# Patient Record
Sex: Male | Born: 1965 | Race: White | Hispanic: No | Marital: Married | Smoking: Never smoker
Health system: Southern US, Community
[De-identification: ages and names within clinical notes are randomized; demographics above are authoritative.]

## PROBLEM LIST (undated history)

## (undated) DIAGNOSIS — E785 Hyperlipidemia, unspecified: Secondary | ICD-10-CM

## (undated) DIAGNOSIS — G4730 Sleep apnea, unspecified: Secondary | ICD-10-CM

## (undated) DIAGNOSIS — I10 Essential (primary) hypertension: Secondary | ICD-10-CM

## (undated) DIAGNOSIS — N400 Enlarged prostate without lower urinary tract symptoms: Secondary | ICD-10-CM

## (undated) DIAGNOSIS — J309 Allergic rhinitis, unspecified: Secondary | ICD-10-CM

## (undated) DIAGNOSIS — C801 Malignant (primary) neoplasm, unspecified: Secondary | ICD-10-CM

## (undated) HISTORY — PX: VASECTOMY: SHX75

## (undated) HISTORY — DX: Allergic rhinitis, unspecified: J30.9

## (undated) HISTORY — DX: Essential (primary) hypertension: I10

## (undated) HISTORY — DX: Benign prostatic hyperplasia without lower urinary tract symptoms: N40.0

## (undated) HISTORY — PX: MELANOMA EXCISION: SHX5266

## (undated) HISTORY — PX: COLONOSCOPY: SHX174

## (undated) HISTORY — DX: Malignant (primary) neoplasm, unspecified: C80.1

## (undated) HISTORY — DX: Hyperlipidemia, unspecified: E78.5

## (undated) HISTORY — DX: Sleep apnea, unspecified: G47.30

---

## 2007-05-19 ENCOUNTER — Ambulatory Visit: Payer: Self-pay | Admitting: Gastroenterology

## 2007-06-02 ENCOUNTER — Ambulatory Visit: Payer: Self-pay | Admitting: Gastroenterology

## 2010-07-01 ENCOUNTER — Ambulatory Visit (HOSPITAL_COMMUNITY): Admission: RE | Admit: 2010-07-01 | Discharge: 2010-07-01 | Payer: Self-pay | Admitting: Internal Medicine

## 2011-11-09 LAB — HM DIABETES EYE EXAM: HM Diabetic Eye Exam: NEGATIVE

## 2012-03-02 ENCOUNTER — Other Ambulatory Visit: Payer: Self-pay | Admitting: Internal Medicine

## 2012-03-03 ENCOUNTER — Ambulatory Visit
Admission: RE | Admit: 2012-03-03 | Discharge: 2012-03-03 | Disposition: A | Discharge status: Home / Self Care | Payer: 59 | Source: Ambulatory Visit | Attending: Internal Medicine | Admitting: Internal Medicine

## 2012-03-03 MED ORDER — IOHEXOL 300 MG/ML  SOLN
75.0000 mL | Freq: Once | INTRAMUSCULAR | Status: AC | PRN
  Administered 2012-03-03: 75 mL via INTRAVENOUS

## 2012-03-08 ENCOUNTER — Encounter: Payer: Self-pay | Admitting: Gastroenterology

## 2012-06-30 ENCOUNTER — Ambulatory Visit (AMBULATORY_SURGERY_CENTER): Payer: 59 | Admitting: *Deleted

## 2012-06-30 ENCOUNTER — Encounter: Payer: Self-pay | Admitting: Gastroenterology

## 2012-06-30 VITALS — Ht 74.0 in | Wt 220.4 lb

## 2012-06-30 DIAGNOSIS — Z1211 Encounter for screening for malignant neoplasm of colon: Secondary | ICD-10-CM

## 2012-06-30 DIAGNOSIS — Z800 Family history of malignant neoplasm of digestive organs: Secondary | ICD-10-CM

## 2012-06-30 MED ORDER — MOVIPREP 100 G PO SOLR: ORAL | Status: DC

## 2012-07-11 ENCOUNTER — Encounter: Payer: 59 | Admitting: Gastroenterology

## 2012-07-13 ENCOUNTER — Encounter: Payer: Self-pay | Admitting: Gastroenterology

## 2012-07-13 ENCOUNTER — Ambulatory Visit (AMBULATORY_SURGERY_CENTER): Payer: 59 | Admitting: Gastroenterology

## 2012-07-13 VITALS — BP 113/87 | HR 76 | Temp 97.3°F | Resp 19 | Ht 74.0 in | Wt 220.0 lb

## 2012-07-13 DIAGNOSIS — Z1211 Encounter for screening for malignant neoplasm of colon: Secondary | ICD-10-CM

## 2012-07-13 DIAGNOSIS — D126 Benign neoplasm of colon, unspecified: Secondary | ICD-10-CM

## 2012-07-13 DIAGNOSIS — Z800 Family history of malignant neoplasm of digestive organs: Secondary | ICD-10-CM

## 2012-07-13 MED ORDER — SODIUM CHLORIDE 0.9 % IV SOLN: 500.0000 mL | INTRAVENOUS | Status: DC

## 2012-07-13 NOTE — Patient Instructions (Signed)
Colon polyp x 1 removed today. Repeat colonoscopy in 5 years. Hold aspirin, aspirin products, and anti-inflammatory medications for 2 weeks,until 07-26-12. Call us with any questions or concerns. Thank you!!!  YOU HAD AN ENDOSCOPIC PROCEDURE TODAY AT THE [facility]: Refer to the procedure report that was given to you for any specific questions about what was found during the examination.  If the procedure report does not answer your questions, please call your gastroenterologist to clarify.  If you requested that your care partner not be given the details of your procedure findings, then the procedure report has been included in a sealed envelope for you to review at your convenience later.  YOU SHOULD EXPECT: Some feelings of bloating in the abdomen. Passage of more gas than usual.  Walking can help get rid of the air that was put into your GI tract during the procedure and reduce the bloating. If you had a lower endoscopy (such as a colonoscopy or flexible sigmoidoscopy) you may notice spotting of blood in your stool or on the toilet paper. If you underwent a bowel prep for your procedure, then you may not have a normal bowel movement for a few days.  DIET: Your first meal following the procedure should be a light meal and then it is ok to progress to your normal diet.  A half-sandwich or bowl of soup is an example of a good first meal.  Heavy or fried foods are harder to digest and may make you feel nauseous or bloated.  Likewise meals heavy in dairy and vegetables can cause extra gas to form and this can also increase the bloating.  Drink plenty of fluids but you should avoid alcoholic beverages for 24 hours.  ACTIVITY: Your care partner should take you home directly after the procedure.  You should plan to take it easy, moving slowly for the rest of the day.  You can resume normal activity the day after the procedure however you should NOT DRIVE or use heavy machinery for 24 hours (because  of the sedation medicines used during the test).    SYMPTOMS TO REPORT IMMEDIATELY: A gastroenterologist can be reached at any hour.  During normal business hours, 8:30 AM to 5:00 PM Monday through Friday, call [phone].  After hours and on weekends, please call the GI answering service at [phone] who will take a message and have the physician on call contact you.   Following lower endoscopy (colonoscopy or flexible sigmoidoscopy):  Excessive amounts of blood in the stool  Significant tenderness or worsening of abdominal pains  Swelling of the abdomen that is new, acute  Fever of 100F or higher  FOLLOW UP: If any biopsies were taken you will be contacted by phone or by letter within the next 1-3 weeks.  Call your gastroenterologist if you have not heard about the biopsies in 3 weeks.  Our staff will call the home number listed on your records the next business day following your procedure to check on you and address any questions or concerns that you may have at that time regarding the information given to you following your procedure. This is a courtesy call and so if there is no answer at the home number and we have not heard from you through the emergency physician on call, we will assume that you have returned to your regular daily activities without incident.  SIGNATURES/CONFIDENTIALITY: You and/or your care partner have signed paperwork which will be entered into your electronic medical  record.  These signatures attest to the fact that that the information above on your After Visit Summary has been reviewed and is understood.  Full responsibility of the confidentiality of this discharge information lies with you and/or your care-partner.

## 2012-07-13 NOTE — Progress Notes (Signed)
Patient did not experience any of the following events: a burn prior to discharge; a fall within the facility; wrong site/side/patient/procedure/implant event; or a hospital transfer or hospital admission upon discharge from the facility. (G8907) Patient did not have preoperative order for IV antibiotic SSI prophylaxis. (G8918)  

## 2012-07-13 NOTE — Op Note (Signed)
[facility] [address] [place], [address]   COLONOSCOPY PROCEDURE REPORT  PATIENT: Kenneth Hoffman, Kenneth Hoffman  MR#: [id number] BIRTHDATE: 12/02/1965 , 46  yrs. old GENDER: Male ENDOSCOPIST: Meryl Dare, MD, [facility] REFERRED BY: PROCEDURE DATE:  07/13/2012 PROCEDURE:   Colonoscopy with snare polypectomy ASA CLASS:   Class II INDICATIONS: elevated risk screening and patient's immediate (mother age 48) family history of colon cancer. MEDICATIONS: MAC sedation, administered by CRNA and propofol (Diprivan) 200mg  IV DESCRIPTION OF PROCEDURE:   After the risks benefits and alternatives of the procedure were thoroughly explained, informed consent was obtained.  A digital rectal exam revealed no abnormalities of the rectum.   The LB CF-[id number]  endoscope was introduced through the anus and advanced to the cecum, which was identified by both the appendix and ileocecal valve. No adverse events experienced.   The quality of the prep was excellent, using MoviPrep  The instrument was then slowly withdrawn as the colon was fully examined.    COLON FINDINGS: A sessile polyp measuring 8 mm in size was found at the cecum.  A polypectomy was performed using snare cautery.  The resection was complete and the polyp tissue was completely retrieved.   The colon was otherwise normal.  There was no diverticulosis, inflamation, polyps or cancers unless previously stated.  Retroflexed views revealed no abnormalities. The time to cecum=2 minutes 04 seconds.  Withdrawal time=11 minutes 48 seconds. The scope was withdrawn and the procedure completed. COMPLICATIONS: There were no complications.  ENDOSCOPIC IMPRESSION: 1.   Sessile polyp at the cecum; polypectomy was performed using snare cautery 2.   The colon was otherwise normal  RECOMMENDATIONS: 1.  Hold aspirin, aspirin products, and anti-inflammatory medication for 2 weeks 2.  await pathology results 3.  repeat Colonoscopy in 5  years  eSigned:  Meryl Dare, MD, [facility] 07/13/2012 11:16 AM   cc: Lucky Cowboy, MD

## 2012-07-14 ENCOUNTER — Telehealth: Payer: Self-pay | Admitting: *Deleted

## 2012-07-14 NOTE — Telephone Encounter (Signed)
  Follow up Call-  Call back number 07/13/2012  Post procedure Call Back phone  # [phone]  Permission to leave phone message Yes     Patient questions:  Do you have a fever, pain , or abdominal swelling? no Pain Score  0 *  Have you tolerated food without any problems? yes  Have you been able to return to your normal activities? yes  Do you have any questions about your discharge instructions: Diet   no Medications  no Follow up visit  no  Do you have questions or concerns about your Care? no  Actions: * If pain score is 4 or above: No action needed, pain <4.

## 2012-07-18 ENCOUNTER — Encounter: Payer: Self-pay | Admitting: Gastroenterology

## 2013-08-19 ENCOUNTER — Encounter: Payer: Self-pay | Admitting: Internal Medicine

## 2013-08-20 ENCOUNTER — Encounter: Payer: Self-pay | Admitting: Internal Medicine

## 2013-08-24 ENCOUNTER — Encounter: Payer: Self-pay | Admitting: Internal Medicine

## 2013-11-12 ENCOUNTER — Encounter: Payer: Self-pay | Admitting: Internal Medicine

## 2013-11-14 ENCOUNTER — Other Ambulatory Visit: Payer: Self-pay | Admitting: Internal Medicine

## 2013-11-14 ENCOUNTER — Encounter: Payer: Self-pay | Admitting: Internal Medicine

## 2013-11-14 ENCOUNTER — Ambulatory Visit (INDEPENDENT_AMBULATORY_CARE_PROVIDER_SITE_OTHER): Payer: 59 | Admitting: Internal Medicine

## 2013-11-14 VITALS — BP 118/72 | HR 80 | Temp 98.0°F | Resp 18

## 2013-11-14 DIAGNOSIS — N400 Enlarged prostate without lower urinary tract symptoms: Secondary | ICD-10-CM

## 2013-11-14 DIAGNOSIS — R200 Anesthesia of skin: Secondary | ICD-10-CM

## 2013-11-14 DIAGNOSIS — M542 Cervicalgia: Secondary | ICD-10-CM

## 2013-11-14 DIAGNOSIS — R209 Unspecified disturbances of skin sensation: Secondary | ICD-10-CM

## 2013-11-14 DIAGNOSIS — T7840XA Allergy, unspecified, initial encounter: Secondary | ICD-10-CM | POA: Insufficient documentation

## 2013-11-14 DIAGNOSIS — E785 Hyperlipidemia, unspecified: Secondary | ICD-10-CM | POA: Insufficient documentation

## 2013-11-14 DIAGNOSIS — I10 Essential (primary) hypertension: Secondary | ICD-10-CM

## 2013-11-14 DIAGNOSIS — R202 Paresthesia of skin: Secondary | ICD-10-CM

## 2013-11-14 DIAGNOSIS — J309 Allergic rhinitis, unspecified: Secondary | ICD-10-CM | POA: Insufficient documentation

## 2013-11-14 NOTE — Progress Notes (Signed)
   Subjective:    Patient ID: Kenneth Hoffman, male    DOB: September 25, 1966, 48 y.o.   MRN: [id number]  HPI 48 yo male with increasing pain in neck radiating to left arm x over 3 weeks. He has been on Prednisone DP, Vimovo BID, Topical OTC Icy Hot, Ice with Tens Unit, Home exercise/ PT for "pinched Nerve" AD by chiropractor, and topical Baclofen/ Gabapentin/Lidocaine/ diclofenac AD without relief. Patient notes original discomfort in neck but now with radiation of tingling sensation down left arm. 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 He denies any trauma or obvious strains, but notes constantly bending over with poor posture while working.    He had CT Cervical Spine 02/28/13 which demonstrated the following bony abnormalities.  Bone windows demonstrate mild spondylosis and C6-7 with  uncovertebral spurring worse right than left.    Medication List       aspirin 81 MG tablet  Take 81 mg by mouth daily.     CLARITIN-D 24 HOUR PO  Take by mouth daily.     lisinopril 10 MG tablet  Commonly known as:  PRINIVIL,ZESTRIL  Take 10 mg by mouth daily.     rosuvastatin 5 MG tablet  Commonly known as:  CRESTOR  Take 5 mg by mouth. Takes 5 mg Mon, Wed, and Fri     rosuvastatin 10 MG tablet  Commonly known as:  CRESTOR  Take 10 mg by mouth. Takes 10 mg Tues, Thurs, Sat, and Sun     tamsulosin 0.4 MG Caps capsule  Commonly known as:  FLOMAX  0.4 mg daily.     Vitamin D 2000 UNITS Caps  Take by mouth. Takes 3 capsules daily to equal 6000 units daily       Review of patient's allergies indicates no known allergies.  Past Medical History  Diagnosis Date  . Hyperlipidemia   . Hypertension   . BPH (benign prostatic hyperplasia)   . Allergic rhinitis, cause unspecified     Review of Systems  Musculoskeletal: Positive for neck pain and neck stiffness.  Neurological: Positive for weakness and numbness.  All other systems reviewed and are  negative.   BP 118/72  Pulse 80  Temp(Src) 98 F (36.7 C)  Resp 18     Objective:   Physical Exam  Nursing note and vitals reviewed. Constitutional: He is oriented to person, place, and time. He appears well-developed and well-nourished.  HENT:  Head: Normocephalic and atraumatic.  Eyes: Conjunctivae are normal. Pupils are equal, round, and reactive to light.  Neck: Normal range of motion.  Cardiovascular: Normal rate, regular rhythm, normal heart sounds and intact distal pulses.   Pulmonary/Chest: Effort normal and breath sounds normal.  Musculoskeletal: He exhibits tenderness.  Tenderness at C5-7 area, with mild decreased ROM laterally  Lymphadenopathy:    He has no cervical adenopathy.  Neurological: He is alert and oriented to person, place, and time. He has normal reflexes. No cranial nerve deficit. He exhibits normal muscle tone.  Skin: Skin is warm and dry.  Psychiatric: He has a normal mood and affect. His behavior is normal. Judgment and thought content normal.          Assessment & Plan:  Neck pain with radiation into left arm needs MRI to evaluate for herniated disc vs nerve damage. Continue current plan of treatment until results final with MRI may need Neuro Referral.

## 2013-11-17 ENCOUNTER — Other Ambulatory Visit: Payer: Self-pay | Admitting: Emergency Medicine

## 2013-12-12 ENCOUNTER — Other Ambulatory Visit: Payer: Self-pay | Admitting: Emergency Medicine

## 2013-12-12 MED ORDER — LISINOPRIL 10 MG PO TABS: 10.0000 mg | ORAL_TABLET | Freq: Every day | ORAL | Status: DC

## 2013-12-12 MED ORDER — TAMSULOSIN HCL 0.4 MG PO CAPS: 0.4000 mg | ORAL_CAPSULE | Freq: Every day | ORAL | Status: DC

## 2013-12-17 ENCOUNTER — Other Ambulatory Visit: Payer: Self-pay | Admitting: Emergency Medicine

## 2014-03-04 ENCOUNTER — Other Ambulatory Visit: Payer: Self-pay | Admitting: Emergency Medicine

## 2014-03-04 MED ORDER — AZITHROMYCIN 250 MG PO TABS: ORAL_TABLET | ORAL | Status: AC

## 2014-04-15 ENCOUNTER — Encounter: Payer: Self-pay | Admitting: Internal Medicine

## 2014-04-15 ENCOUNTER — Ambulatory Visit (INDEPENDENT_AMBULATORY_CARE_PROVIDER_SITE_OTHER): Payer: 59 | Admitting: Internal Medicine

## 2014-04-15 VITALS — BP 124/82 | HR 84 | Temp 97.9°F | Resp 16 | Ht 73.0 in | Wt 234.8 lb

## 2014-04-15 DIAGNOSIS — Z113 Encounter for screening for infections with a predominantly sexual mode of transmission: Secondary | ICD-10-CM

## 2014-04-15 DIAGNOSIS — Z79899 Other long term (current) drug therapy: Secondary | ICD-10-CM | POA: Insufficient documentation

## 2014-04-15 DIAGNOSIS — Z0000 Encounter for general adult medical examination without abnormal findings: Secondary | ICD-10-CM

## 2014-04-15 DIAGNOSIS — E291 Testicular hypofunction: Secondary | ICD-10-CM

## 2014-04-15 DIAGNOSIS — Z125 Encounter for screening for malignant neoplasm of prostate: Secondary | ICD-10-CM

## 2014-04-15 DIAGNOSIS — E349 Endocrine disorder, unspecified: Secondary | ICD-10-CM | POA: Insufficient documentation

## 2014-04-15 DIAGNOSIS — Z111 Encounter for screening for respiratory tuberculosis: Secondary | ICD-10-CM

## 2014-04-15 DIAGNOSIS — R7402 Elevation of levels of lactic acid dehydrogenase (LDH): Secondary | ICD-10-CM

## 2014-04-15 DIAGNOSIS — Z1212 Encounter for screening for malignant neoplasm of rectum: Secondary | ICD-10-CM

## 2014-04-15 DIAGNOSIS — E559 Vitamin D deficiency, unspecified: Secondary | ICD-10-CM

## 2014-04-15 DIAGNOSIS — R740 Nonspecific elevation of levels of transaminase and lactic acid dehydrogenase [LDH]: Secondary | ICD-10-CM

## 2014-04-15 DIAGNOSIS — I10 Essential (primary) hypertension: Secondary | ICD-10-CM

## 2014-04-15 DIAGNOSIS — R7401 Elevation of levels of liver transaminase levels: Secondary | ICD-10-CM

## 2014-04-15 LAB — CBC WITH DIFFERENTIAL/PLATELET
BASOS PCT: 1 % (ref 0–1)
Basophils Absolute: 0.1 10*3/uL (ref 0.0–0.1)
Eosinophils Absolute: 0.1 10*3/uL (ref 0.0–0.7)
Eosinophils Relative: 2 % (ref 0–5)
HEMATOCRIT: 46.6 % (ref 39.0–52.0)
HEMOGLOBIN: 16.7 g/dL (ref 13.0–17.0)
LYMPHS ABS: 1.7 10*3/uL (ref 0.7–4.0)
Lymphocytes Relative: 30 % (ref 12–46)
MCH: 30.4 pg (ref 26.0–34.0)
MCHC: 35.8 g/dL (ref 30.0–36.0)
MCV: 84.7 fL (ref 78.0–100.0)
MONOS PCT: 8 % (ref 3–12)
Monocytes Absolute: 0.4 10*3/uL (ref 0.1–1.0)
NEUTROS ABS: 3.3 10*3/uL (ref 1.7–7.7)
Neutrophils Relative %: 59 % (ref 43–77)
Platelets: 202 10*3/uL (ref 150–400)
RBC: 5.5 MIL/uL (ref 4.22–5.81)
RDW: 13.3 % (ref 11.5–15.5)
WBC: 5.6 10*3/uL (ref 4.0–10.5)

## 2014-04-15 LAB — HEMOGLOBIN A1C
Hgb A1c MFr Bld: 5.4 % (ref <5.7)
MEAN PLASMA GLUCOSE: 108 mg/dL (ref <117)

## 2014-04-15 NOTE — Progress Notes (Signed)
Patient ID: Kenneth Hoffman, male   DOB: 1966/04/22, 48 y.o.   MRN: [id number]   Annual Screening Comprehensive Examination  This very nice 48 y.o.MWM presents for complete physical.  Patient has been followed for HTN,  Prediabetes, Hyperlipidemia, Testosterone and Vitamin D Deficiency.   HTN predates since 2010. Patient's BP has been controlled at home.Today's BP: 124/82 mmHg. Patient denies any cardiac symptoms as chest pain, palpitations, shortness of breath, dizziness or ankle swelling.   Patient's hyperlipidemia is controlled with diet and medications. Patient denies myalgias or other medication SE's. Last cholesterol last visit was 133, triglycerides 232, HDL 34 and LDL 53 in Nov 2014 - all at goal.     Patient has Obesity (BMI 31) and consequent prediabetes with A1c 5.7% in Sept 2011 and wit improved diet  last A1c was 5.5% with elevated insulin 41 in Nov 2014. Patient denies reactive hypoglycemic symptoms, visual blurring, diabetic polys or paresthesias.    Patient has Hx/o low Testosterone 185 in June 2011 and has been on replacement Therapy with improved sense of well- being. Finally, patient has history of Vitamin D Deficiency of 42 in 2008 and last vitamin D was 33 in Nov 2014.  Medication Sig  . aspirin 81 MG tab Take 81 mg by mouth daily.  Marland Kitchen VITAMIN D 2000 U Takes  6000 units daily  . lisinopril  10 MG tab Takes 1 tablet (10 mg total) by mouth daily.  Marland Kitchen CLARITIN-D 24 HOUR Takes 1  daily.  . rosuvastatin  10 MG tab Takes 10 mg Tues, Thurs, Sat, and Sun  . rosuvastatin 5 MG tab Takes 5 mg Mon, Wed, and Fri    Depo- Testosterone 200 mg/ml Takes 2 ml q 2 weeks   . tamsulosin  0.4 MG CAP Takes 1 cap  daily.   No Known Allergies  Past Medical History  Diagnosis Date  . Hyperlipidemia   . Hypertension   . BPH (benign prostatic hyperplasia)   . Allergic rhinitis, cause unspecified    Past Surgical History  Procedure Laterality Date  . Colonoscopy  2008    Dr. Fuller Plan; family hx  colon cancer in mother  . Vasectomy     Family History  Problem Relation Age of Onset  . Colon cancer Mother 20  . Cancer Mother   . Diabetes Mother   . Kidney disease Mother   . COPD Mother   . Stomach cancer Neg Hx   . Cancer Father     Prostate, Lymphoma  . Hyperlipidemia Father   . Stroke Father   . Hyperlipidemia Brother    History   Social History  . Marital Status: Married    Spouse Name: N/A    Number of Children: N/A  . Years of Education: N/A   Occupational History  . [employer]  Management   Social History Main Topics  . Smoking status: Never Smoker   . Smokeless tobacco: Never Used  . Alcohol Use: Yes     Comment: rarely  . Drug Use: No  . Sexual Activity: Yes    Birth Control/ Protection: Surgical    ROS Constitutional: Denies fever, chills, weight loss/gain, headaches, insomnia, fatigue, night sweats or change in appetite. Eyes: Denies redness, blurred vision, diplopia, discharge, itchy or watery eyes.  ENT: Denies discharge, congestion, post nasal drip, epistaxis, sore throat, earache, hearing loss, dental pain, Tinnitus, Vertigo, Sinus pain or snoring.  Cardio: Denies chest pain, palpitations, irregular heartbeat, syncope, dyspnea, diaphoresis, orthopnea, PND, claudication or  edema Respiratory: denies cough, dyspnea, DOE, pleurisy, hoarseness, laryngitis or wheezing.  Gastrointestinal: Denies dysphagia, heartburn, reflux, water brash, pain, cramps, nausea, vomiting, bloating, diarrhea, constipation, hematemesis, melena, hematochezia, jaundice or hemorrhoids Genitourinary: Denies dysuria, frequency, urgency, nocturia, hesitancy, discharge, hematuria or flank pain Musculoskeletal: Denies arthralgia, myalgia, stiffness, Jt. Swelling, pain, limp or strain/sprain. Skin: Denies puritis, rash, hives, warts, acne, eczema or change in skin lesion Neuro: No weakness, tremor, incoordination, spasms, paresthesia or pain Psychiatric: Denies confusion, memory  loss or sensory loss Endocrine: Denies change in weight, skin, hair change, nocturia, and paresthesia, diabetic polys, visual blurring or hyper / hypo glycemic episodes.  Heme/Lymph: No excessive bleeding, bruising or enlarged lymph nodes.  Physical Exam  BP 124/82  P 84  T 97.9 F  Resp 16  Ht 6\' 1"    Wt 234 lb 12.8 oz   BMI 30.98 kg/m2  General Appearance: Well nourished, in no apparent distress. Eyes: PERRLA, EOMs, conjunctiva no swelling or erythema, normal fundi and vessels. Sinuses: No frontal/maxillary tenderness ENT/Mouth: EACs patent / TMs  nl. Nares clear without erythema, swelling, mucoid exudates. Oral hygiene is good. No erythema, swelling, or exudate. Tongue normal, non-obstructing. Tonsils not swollen or erythematous. Hearing normal.  Neck: Supple, thyroid normal. No bruits, nodes or JVD. Respiratory: Respiratory effort normal.  BS equal and clear bilateral without rales, rhonci, wheezing or stridor. Cardio: Heart sounds are normal with regular rate and rhythm and no murmurs, rubs or gallops. Peripheral pulses are normal and equal bilaterally without edema. Chest: symmetric with normal excursions and percussion.  Abdomen: Flat, soft, with bowl sounds. Nontender, no guarding, rebound, hernias, masses, or organomegaly.  Lymphatics: Non tender without lymphadenopathy.  Genitourinary: No hernias.Testes nl. DRE - prostate nl for age - smooth & firm w/o nodules. Musculoskeletal: Full ROM all peripheral extremities, joint stability, 5/5 strength, and normal gait. Skin: Warm and dry without rashes, lesions, cyanosis, clubbing or  ecchymosis.  Neuro: Cranial nerves intact, reflexes equal bilaterally. Normal muscle tone, no cerebellar symptoms. Sensation intact.  Pysch: Awake and oriented X 3, normal affect, insight and judgment appropriate.   Assessment and Plan  1. Annual Screening Examination 2. Hypertension  3. Hyperlipidemia 4. Pre Diabetes/Insulin Resistance 5. Vitamin  D Deficiency 6. Testosterone Deficiency  Continue prudent diet as discussed, weight control, BP monitoring, regular exercise, and medications as discussed.  Discussed med effects and SE's. Routine screening labs and tests as requested with regular follow-up as recommended.

## 2014-04-15 NOTE — Patient Instructions (Addendum)
 Testosterone injection What is this medicine? TESTOSTERONE (tes TOS ter one) is the main male hormone. It supports normal male development such as muscle growth, facial hair, and deep voice. It is used in males to treat low testosterone levels. This medicine may be used for other purposes; ask your health care provider or pharmacist if you have questions. COMMON BRAND NAME(S): Andro-L.A., Aveed, Delatestryl, Depo-Testosterone, Virilon What should I tell my health care provider before I take this medicine? They need to know if you have any of these conditions: -breast cancer -diabetes -heart disease -kidney disease -liver disease -lung disease -prostate cancer, enlargement -an unusual or allergic reaction to testosterone, other medicines, foods, dyes, or preservatives -pregnant or trying to get pregnant -breast-feeding How should I use this medicine? This medicine is for injection into a muscle. It is usually given by a health care professional in a hospital or clinic setting. Contact your pediatrician regarding the use of this medicine in children. While this medicine may be prescribed for children as young as 12 years of age for selected conditions, precautions do apply. Overdosage: If you think you have taken too much of this medicine contact a poison control center or emergency room at once. NOTE: This medicine is only for you. Do not share this medicine with others. What if I miss a dose? Try not to miss a dose. Your doctor or health care professional will tell you when your next injection is due. Notify the office if you are unable to keep an appointment. What may interact with this medicine? -medicines for diabetes -medicines that treat or prevent blood clots like warfarin -oxyphenbutazone -propranolol -steroid medicines like prednisone or cortisone This list may not describe all possible interactions. Give your health care provider a list of all the medicines, herbs,  non-prescription drugs, or dietary supplements you use. Also tell them if you smoke, drink alcohol, or use illegal drugs. Some items may interact with your medicine. What should I watch for while using this medicine? Visit your doctor or health care professional for regular checks on your progress. They will need to check the level of testosterone in your blood. This medicine may affect blood sugar levels. If you have diabetes, check with your doctor or health care professional before you change your diet or the dose of your diabetic medicine. This drug is banned from use in athletes by most athletic organizations. What side effects may I notice from receiving this medicine? Side effects that you should report to your doctor or health care professional as soon as possible: -allergic reactions like skin rash, itching or hives, swelling of the face, lips, or tongue -breast enlargement -breathing problems -changes in mood, especially anger, depression, or rage -dark urine -general ill feeling or flu-like symptoms -light-colored stools -loss of appetite, nausea -nausea, vomiting -right upper belly pain -stomach pain -swelling of ankles -too frequent or persistent erections -trouble passing urine or change in the amount of urine -unusually weak or tired -yellowing of the eyes or skin Additional side effects that can occur in women include: -deep or hoarse voice -facial hair growth -irregular menstrual periods Side effects that usually do not require medical attention (report to your doctor or health care professional if they continue or are bothersome): -acne -change in sex drive or performance -hair loss -headache This list may not describe all possible side effects. Call your doctor for medical advice about side effects. You may report side effects to FDA at 1-800-FDA-1088. Where should I keep my medicine?   Keep out of the reach of children. This medicine can be abused. Keep your  medicine in a safe place to protect it from theft. Do not share this medicine with anyone. Selling or giving away this medicine is dangerous and against the law. Store at room temperature between 20 and 25 degrees C (68 and 77 degrees F). Do not freeze. Protect from light. Follow the directions for the product you are prescribed. Throw away any unused medicine after the expiration date. NOTE: This sheet is a summary. It may not cover all possible information. If you have questions about this medicine, talk to your doctor, pharmacist, or health care provider.  2014, [employer]vier/Gold Standard. (2008-01-05 16:13:46)  Hypertension As your heart beats, it forces blood through your arteries. This force is your blood pressure. If the pressure is too high, it is called hypertension (HTN) or high blood pressure. HTN is dangerous because you may have it and not know it. High blood pressure may mean that your heart has to work harder to pump blood. Your arteries may be narrow or stiff. The extra work puts you at risk for heart disease, stroke, and other problems.  Blood pressure consists of two numbers, a higher number over a lower, 110/72, for example. It is stated as "110 over 72." The ideal is below 120 for the top number (systolic) and under 80 for the bottom (diastolic). Write down your blood pressure today. You should pay close attention to your blood pressure if you have certain conditions such as:  Heart failure.  Prior heart attack.  Diabetes  Chronic kidney disease.  Prior stroke.  Multiple risk factors for heart disease. To see if you have HTN, your blood pressure should be measured while you are seated with your arm held at the level of the heart. It should be measured at least twice. A one-time elevated blood pressure reading (especially in the Emergency Department) does not mean that you need treatment. There may be conditions in which the blood pressure is different between your right and left  arms. It is important to see your caregiver soon for a recheck. Most people have essential hypertension which means that there is not a specific cause. This type of high blood pressure may be lowered by changing lifestyle factors such as:  Stress.  Smoking.  Lack of exercise.  Excessive weight.  Drug/tobacco/alcohol use.  Eating less salt. Most people do not have symptoms from high blood pressure until it has caused damage to the body. Effective treatment can often prevent, delay or reduce that damage. TREATMENT  When a cause has been identified, treatment for high blood pressure is directed at the cause. There are a large number of medications to treat HTN. These fall into several categories, and your caregiver will help you select the medicines that are best for you. Medications may have side effects. You should review side effects with your caregiver. If your blood pressure stays high after you have made lifestyle changes or started on medicines,   Your medication(s) may need to be changed.  Other problems may need to be addressed.  Be certain you understand your prescriptions, and know how and when to take your medicine.  Be sure to follow up with your caregiver within the time frame advised (usually within two weeks) to have your blood pressure rechecked and to review your medications.  If you are taking more than one medicine to lower your blood pressure, make sure you know how and at what times   they should be taken. Taking two medicines at the same time can result in blood pressure that is too low. SEEK IMMEDIATE MEDICAL CARE IF:  You develop a severe headache, blurred or changing vision, or confusion.  You have unusual weakness or numbness, or a faint feeling.  You have severe chest or abdominal pain, vomiting, or breathing problems. MAKE SURE YOU:   Understand these instructions.  Will watch your condition.  Will get help right away if you are not doing well or get  worse.   Diabetes and Exercise Exercising regularly is important. It is not just about losing weight. It has many health benefits, such as:  Improving your overall fitness, flexibility, and endurance.  Increasing your bone density.  Helping with weight control.  Decreasing your body fat.  Increasing your muscle strength.  Reducing stress and tension.  Improving your overall health. People with diabetes who exercise gain additional benefits because exercise:  Reduces appetite.  Improves the body's use of blood sugar (glucose).  Helps lower or control blood glucose.  Decreases blood pressure.  Helps control blood lipids (such as cholesterol and triglycerides).  Improves the body's use of the hormone insulin by:  Increasing the body's insulin sensitivity.  Reducing the body's insulin needs.  Decreases the risk for heart disease because exercising:  Lowers cholesterol and triglycerides levels.  Increases the levels of good cholesterol (such as high-density lipoproteins [HDL]) in the body.  Lowers blood glucose levels. YOUR ACTIVITY PLAN  Choose an activity that you enjoy and set realistic goals. Your health care provider or diabetes educator can help you make an activity plan that works for you. You can break activities into 2 or 3 sessions throughout the day. Doing so is as good as one long session. Exercise ideas include:  Taking the dog for a walk.  Taking the stairs instead of the elevator.  Dancing to your favorite song.  Doing your favorite exercise with a friend. RECOMMENDATIONS FOR EXERCISING WITH TYPE 1 OR TYPE 2 DIABETES   Check your blood glucose before exercising. If blood glucose levels are greater than 240 mg/dL, check for urine ketones. Do not exercise if ketones are present.  Avoid injecting insulin into areas of the body that are going to be exercised. For example, avoid injecting insulin into:  The arms when playing tennis.  The legs when  jogging.  Keep a record of:  Food intake before and after you exercise.  Expected peak times of insulin action.  Blood glucose levels before and after you exercise.  The type and amount of exercise you have done.  Review your records with your health care provider. Your health care provider will help you to develop guidelines for adjusting food intake and insulin amounts before and after exercising.  If you take insulin or oral hypoglycemic agents, watch for signs and symptoms of hypoglycemia. They include:  Dizziness.  Shaking.  Sweating.  Chills.  Confusion.  Drink plenty of water while you exercise to prevent dehydration or heat stroke. Body water is lost during exercise and must be replaced.  Talk to your health care provider before starting an exercise program to make sure it is safe for you. Remember, almost any type of activity is better than none.    Cholesterol Cholesterol is a white, waxy, fat-like protein needed by your body in small amounts. The liver makes all the cholesterol you need. It is carried from the liver by the blood through the blood vessels. Deposits (plaque) may build   up on blood vessel walls. This makes the arteries narrower and stiffer. Plaque increases the risk for heart attack and stroke. You cannot feel your cholesterol level even if it is very high. The only way to know is by a blood test to check your lipid (fats) levels. Once you know your cholesterol levels, you should keep a record of the test results. Work with your caregiver to to keep your levels in the desired range. WHAT THE RESULTS MEAN:  Total cholesterol is a rough measure of all the cholesterol in your blood.  LDL is the so-called bad cholesterol. This is the type that deposits cholesterol in the walls of the arteries. You want this level to be low.  HDL is the good cholesterol because it cleans the arteries and carries the LDL away. You want this level to be high.  Triglycerides  are fat that the body can either burn for energy or store. High levels are closely linked to heart disease. DESIRED LEVELS:  Total cholesterol below 200.  LDL below 100 for people at risk, below 70 for very high risk.  HDL above 50 is good, above 60 is best.  Triglycerides below 150. HOW TO LOWER YOUR CHOLESTEROL:  Diet.  Choose fish or white meat chicken and turkey, roasted or baked. Limit fatty cuts of red meat, fried foods, and processed meats, such as sausage and lunch meat.  Eat lots of fresh fruits and vegetables. Choose whole grains, beans, pasta, potatoes and cereals.  Use only small amounts of olive, corn or canola oils. Avoid butter, mayonnaise, shortening or palm kernel oils. Avoid foods with trans-fats.  Use skim/nonfat milk and low-fat/nonfat yogurt and cheeses. Avoid whole milk, cream, ice cream, egg yolks and cheeses. Healthy desserts include angel food cake, ginger snaps, animal crackers, hard candy, popsicles, and low-fat/nonfat frozen yogurt. Avoid pastries, cakes, pies and cookies.  Exercise.  A regular program helps decrease LDL and raises HDL.  Helps with weight control.  Do things that increase your activity level like gardening, walking, or taking the stairs.  Medication.  May be prescribed by your caregiver to help lowering cholesterol and the risk for heart disease.  You may need medicine even if your levels are normal if you have several risk factors. HOME CARE INSTRUCTIONS   Follow your diet and exercise programs as suggested by your caregiver.  Take medications as directed.  Have blood work done when your caregiver feels it is necessary. MAKE SURE YOU:   Understand these instructions.  Will watch your condition.  Will get help right away if you are not doing well or get worse.      Vitamin D Deficiency Vitamin D is an important vitamin that your body needs. Having too little of it in your body is called a deficiency. A very bad  deficiency can make your bones soft and can cause a condition called rickets.  Vitamin D is important to your body for different reasons, such as:   It helps your body absorb 2 minerals called calcium and phosphorus.  It helps make your bones healthy.  It may prevent some diseases, such as diabetes and multiple sclerosis.  It helps your muscles and heart. You can get vitamin D in several ways. It is a natural part of some foods. The vitamin is also added to some dairy products and cereals. Some people take vitamin D supplements. Also, your body makes vitamin D when you are in the sun. It changes the sun's rays into a   form of the vitamin that your body can use. CAUSES   Not eating enough foods that contain vitamin D.  Not getting enough sunlight.  Having certain digestive system diseases that make it hard to absorb vitamin D. These diseases include Crohn's disease, chronic pancreatitis, and cystic fibrosis.  Having a surgery in which part of the stomach or small intestine is removed.  Being obese. Fat cells pull vitamin D out of your blood. That means that obese people may not have enough vitamin D left in their blood and in other body tissues.  Having chronic kidney or liver disease. RISK FACTORS Risk factors are things that make you more likely to develop a vitamin D deficiency. They include:  Being older.  Not being able to get outside very much.  Living in a nursing home.  Having had broken bones.  Having weak or thin bones (osteoporosis).  Having a disease or condition that changes how your body absorbs vitamin D.  Having dark skin.  Some medicines such as seizure medicines or steroids.  Being overweight or obese. SYMPTOMS Mild cases of vitamin D deficiency may not have any symptoms. If you have a very bad case, symptoms may include:  Bone pain.  Muscle pain.  Falling often.  Broken bones caused by a minor injury, due to osteoporosis. DIAGNOSIS A blood test  is the best way to tell if you have a vitamin D deficiency. TREATMENT Vitamin D deficiency can be treated in different ways. Treatment for vitamin D deficiency depends on what is causing it. Options include:  Taking vitamin D supplements.  Taking a calcium supplement. Your caregiver will suggest what dose is best for you. HOME CARE INSTRUCTIONS  Take any supplements that your caregiver prescribes. Follow the directions carefully. Take only the suggested amount.  Have your blood tested 2 months after you start taking supplements.  Eat foods that contain vitamin D. Healthy choices include:  Fortified dairy products, cereals, or juices. Fortified means vitamin D has been added to the food. Check the label on the package to be sure.  Fatty fish like salmon or trout.  Eggs.  Oysters.  Do not use a tanning bed.  Keep your weight at a healthy level. Lose weight if you need to.  Keep all follow-up appointments. Your caregiver will need to perform blood tests to make sure your vitamin D deficiency is going away. SEEK MEDICAL CARE IF:  You have any questions about your treatment.  You continue to have symptoms of vitamin D deficiency.  You have nausea or vomiting.  You are constipated.  You feel confused.  You have severe abdominal or back pain. MAKE SURE YOU:  Understand these instructions.  Will watch your condition.  Will get help right away if you are not doing well or get worse.   

## 2014-04-16 LAB — HEPATIC FUNCTION PANEL
ALK PHOS: 72 U/L (ref 39–117)
ALT: 35 U/L (ref 0–53)
AST: 29 U/L (ref 0–37)
Albumin: 4.2 g/dL (ref 3.5–5.2)
Bilirubin, Direct: 0.2 mg/dL (ref 0.0–0.3)
Indirect Bilirubin: 0.6 mg/dL (ref 0.2–1.2)
TOTAL PROTEIN: 6.8 g/dL (ref 6.0–8.3)
Total Bilirubin: 0.8 mg/dL (ref 0.2–1.2)

## 2014-04-16 LAB — BASIC METABOLIC PANEL WITH GFR
BUN: 14 mg/dL (ref 6–23)
CO2: 24 meq/L (ref 19–32)
Calcium: 9 mg/dL (ref 8.4–10.5)
Chloride: 102 mEq/L (ref 96–112)
Creat: 0.93 mg/dL (ref 0.50–1.35)
GFR, Est African American: >89 mL/min
GFR, Est Non African American: >89 mL/min
GLUCOSE: 96 mg/dL (ref 70–99)
POTASSIUM: 3.7 meq/L (ref 3.5–5.3)
Sodium: 135 mEq/L (ref 135–145)

## 2014-04-16 LAB — RPR

## 2014-04-16 LAB — VITAMIN B12: Vitamin B-12: 416 pg/mL (ref 211–911)

## 2014-04-16 LAB — LIPID PANEL
CHOL/HDL RATIO: 4.5 ratio
Cholesterol: 140 mg/dL (ref 0–200)
HDL: 31 mg/dL — ABNORMAL LOW (ref >39)
LDL CALC: 35 mg/dL (ref 0–99)
Triglycerides: 372 mg/dL — ABNORMAL HIGH (ref <150)
VLDL: 74 mg/dL — ABNORMAL HIGH (ref 0–40)

## 2014-04-16 LAB — TESTOSTERONE: Testosterone: 201 ng/dL — ABNORMAL LOW (ref 300–890)

## 2014-04-16 LAB — HEPATITIS C ANTIBODY: HCV Ab: NEGATIVE

## 2014-04-16 LAB — MICROALBUMIN / CREATININE URINE RATIO
CREATININE, URINE: 87.8 mg/dL
MICROALB UR: 0.5 mg/dL (ref 0.00–1.89)
Microalb Creat Ratio: 5.7 mg/g (ref 0.0–30.0)

## 2014-04-16 LAB — URINALYSIS, MICROSCOPIC ONLY
Bacteria, UA: NONE SEEN
CASTS: NONE SEEN
CRYSTALS: NONE SEEN
SQUAMOUS EPITHELIAL / LPF: NONE SEEN

## 2014-04-16 LAB — INSULIN, FASTING: Insulin fasting, serum: 131 u[IU]/mL — ABNORMAL HIGH (ref 3–28)

## 2014-04-16 LAB — MAGNESIUM: Magnesium: 2.1 mg/dL (ref 1.5–2.5)

## 2014-04-16 LAB — HEPATITIS B CORE ANTIBODY, TOTAL: HEP B C TOTAL AB: NONREACTIVE

## 2014-04-16 LAB — HEPATITIS B SURFACE ANTIBODY,QUALITATIVE: Hep B S Ab: NEGATIVE

## 2014-04-16 LAB — VITAMIN D 25 HYDROXY (VIT D DEFICIENCY, FRACTURES): Vit D, 25-Hydroxy: 79 ng/mL (ref 30–89)

## 2014-04-16 LAB — HIV ANTIBODY (ROUTINE TESTING W REFLEX): HIV 1&2 Ab, 4th Generation: NONREACTIVE

## 2014-04-16 LAB — PSA: PSA: 0.65 ng/mL (ref <=4.00)

## 2014-04-16 LAB — TSH: TSH: 1.358 u[IU]/mL (ref 0.350–4.500)

## 2014-04-16 LAB — HEPATITIS A ANTIBODY, TOTAL: HEP A TOTAL AB: NONREACTIVE

## 2014-04-17 LAB — HEPATITIS B E ANTIBODY: HEPATITIS BE ANTIBODY: NONREACTIVE

## 2014-04-24 LAB — TB SKIN TEST
Induration: 0 mm
TB Skin Test: NEGATIVE

## 2014-04-26 ENCOUNTER — Other Ambulatory Visit: Payer: Self-pay | Admitting: Emergency Medicine

## 2014-04-26 MED ORDER — AZITHROMYCIN 250 MG PO TABS: ORAL_TABLET | ORAL | Status: AC

## 2014-05-17 ENCOUNTER — Other Ambulatory Visit: Payer: Self-pay | Admitting: *Deleted

## 2014-05-17 MED ORDER — TAMSULOSIN HCL 0.4 MG PO CAPS: 0.4000 mg | ORAL_CAPSULE | Freq: Every day | ORAL | Status: DC

## 2014-07-19 ENCOUNTER — Ambulatory Visit (INDEPENDENT_AMBULATORY_CARE_PROVIDER_SITE_OTHER): Payer: 59 | Admitting: Internal Medicine

## 2014-07-19 ENCOUNTER — Encounter: Payer: Self-pay | Admitting: Internal Medicine

## 2014-07-19 VITALS — BP 116/86 | HR 84 | Temp 98.1°F | Resp 16 | Ht 73.0 in | Wt 236.8 lb

## 2014-07-19 DIAGNOSIS — E559 Vitamin D deficiency, unspecified: Secondary | ICD-10-CM

## 2014-07-19 DIAGNOSIS — Z79899 Other long term (current) drug therapy: Secondary | ICD-10-CM

## 2014-07-19 DIAGNOSIS — E291 Testicular hypofunction: Secondary | ICD-10-CM

## 2014-07-19 DIAGNOSIS — R7309 Other abnormal glucose: Secondary | ICD-10-CM

## 2014-07-19 DIAGNOSIS — I10 Essential (primary) hypertension: Secondary | ICD-10-CM

## 2014-07-19 DIAGNOSIS — E785 Hyperlipidemia, unspecified: Secondary | ICD-10-CM

## 2014-07-19 LAB — CBC WITH DIFFERENTIAL/PLATELET
Basophils Absolute: 0 10*3/uL (ref 0.0–0.1)
Basophils Relative: 0 % (ref 0–1)
EOS ABS: 0 10*3/uL (ref 0.0–0.7)
EOS PCT: 1 % (ref 0–5)
HCT: 47 % (ref 39.0–52.0)
HEMOGLOBIN: 16.4 g/dL (ref 13.0–17.0)
LYMPHS ABS: 1.5 10*3/uL (ref 0.7–4.0)
Lymphocytes Relative: 32 % (ref 12–46)
MCH: 29.8 pg (ref 26.0–34.0)
MCHC: 34.9 g/dL (ref 30.0–36.0)
MCV: 85.5 fL (ref 78.0–100.0)
MONOS PCT: 10 % (ref 3–12)
Monocytes Absolute: 0.5 10*3/uL (ref 0.1–1.0)
NEUTROS PCT: 57 % (ref 43–77)
Neutro Abs: 2.7 10*3/uL (ref 1.7–7.7)
Platelets: 200 10*3/uL (ref 150–400)
RBC: 5.5 MIL/uL (ref 4.22–5.81)
RDW: 13.2 % (ref 11.5–15.5)
WBC: 4.7 10*3/uL (ref 4.0–10.5)

## 2014-07-19 NOTE — Patient Instructions (Signed)

## 2014-07-19 NOTE — Progress Notes (Signed)
Patient ID: Kenneth Hoffman, male   DOB: January 21, 1966, 48 y.o.   MRN: [id number]   This very nice 48 y.o.MWM presents for 3 month follow up with Hypertension, Hyperlipidemia, Pre-Diabetes Testosterone Deficiency and Vitamin D Deficiency.    Patient is treated for HTN (2010) & BP has been controlled and today's BP: 116/86 mmHg. Patient denies any cardiac type chest pain, palpitations, dyspnea/orthopnea/PND, dizziness, claudication, or dependent edema.   Hyperlipidemia is controlled with diet & meds with cholesterol at goal, but elevated Trig's. Patient denies myalgias or other med SE's. Last Lipids were  Total Chol 140; HDL Chol31*; LDL  35; Trig 372* on 04/15/2014.   Also, the patient has Obesity (BMI 31.5) and consequent of PreDiabetes (A1c 5.7% in 2011)  and patient denies any symptoms of reactive hypoglycemia, diabetic polys, paresthesias or visual blurring.  Last A1c was normal range 5.4% on  04/15/2014.     Patient has Hx/o Testosterone Deficiency (185 in 2011) and had ben on Depo-T , but stopped for perieived lack of benefit . Further, Patient has history of Vitamin D Deficiency (42 in 2008) and patient supplements vitamin D without any suspected side-effects. Last vitamin D was 79 on 04/15/2014.    Medication List   aspirin 81 MG tablet  Take 81 mg by mouth daily.     CLARITIN-D 24 HOUR PO  Take by mouth daily.     lisinopril 10 MG tablet  Commonly known as:  PRINIVIL,ZESTRIL  Take 1 tablet (10 mg total) by mouth daily.     rosuvastatin 5 MG tablet  Commonly known as:  CRESTOR  Take 5 mg by mouth. Takes 5 mg Mon, Wed, and Fri     rosuvastatin 10 MG tablet  Commonly known as:  CRESTOR  Take 10 mg by mouth. Takes 10 mg Tues, Thurs, Sat, and Sun     tamsulosin 0.4 MG Caps capsule  Commonly known as:  FLOMAX  Take 1 capsule (0.4 mg total) by mouth daily.     Vitamin D 2000 UNITS Caps  Take by mouth. Takes 3 capsules daily to equal 6000 units daily     No Known Allergies  PMHx:   Past  Medical History  Diagnosis Date  . Hyperlipidemia   . Hypertension   . BPH (benign prostatic hyperplasia)   . Allergic rhinitis, cause unspecified    FHx:    Reviewed / unchanged SHx:    Reviewed / unchanged  Systems Review:  Constitutional: Denies fever, chills, wt changes, headaches, insomnia, fatigue, night sweats, change in appetite. Eyes: Denies redness, blurred vision, diplopia, discharge, itchy, watery eyes.  ENT: Denies discharge, congestion, post nasal drip, epistaxis, sore throat, earache, hearing loss, dental pain, tinnitus, vertigo, sinus pain, snoring.  CV: Denies chest pain, palpitations, irregular heartbeat, syncope, dyspnea, diaphoresis, orthopnea, PND, claudication or edema. Respiratory: denies cough, dyspnea, DOE, pleurisy, hoarseness, laryngitis, wheezing.  Gastrointestinal: Denies dysphagia, odynophagia, heartburn, reflux, water brash, abdominal pain or cramps, nausea, vomiting, bloating, diarrhea, constipation, hematemesis, melena, hematochezia  or hemorrhoids. Genitourinary: Denies dysuria, frequency, urgency, nocturia, hesitancy, discharge, hematuria or flank pain. Musculoskeletal: Denies arthralgias, myalgias, stiffness, jt. swelling, pain, limping or strain/sprain.  Skin: Denies pruritus, rash, hives, warts, acne, eczema or change in skin lesion(s). Neuro: No weakness, tremor, incoordination, spasms, paresthesia or pain. Psychiatric: Denies confusion, memory loss or sensory loss. Endo: Denies change in weight, skin or hair change.  Heme/Lymph: No excessive bleeding, bruising or enlarged lymph nodes.  Exam:  BP 116/86  Pulse 84  Temp 98.1  F   Resp 16  Ht 6\' 1"    Wt 236 lb 12.8 oz  BMI 31.25   Appears well nourished and in no distress. Eyes: PERRLA, EOMs, conjunctiva no swelling or erythema. Sinuses: No frontal/maxillary tenderness ENT/Mouth: EAC's clear, TM's nl w/o erythema, bulging. Nares clear w/o erythema, swelling, exudates. Oropharynx clear without  erythema or exudates. Oral hygiene is good. Tongue normal, non obstructing. Hearing intact.  Neck: Supple. Thyroid nl. Car 2+/2+ without bruits, nodes or JVD. Chest: Respirations nl with BS clear & equal w/o rales, rhonchi, wheezing or stridor.  Cor: Heart sounds normal w/ regular rate and rhythm without sig. murmurs, gallops, clicks, or rubs. Peripheral pulses normal and equal  without edema.  Abdomen: Soft & bowel sounds normal. Non-tender w/o guarding, rebound, hernias, masses, or organomegaly.  Lymphatics: Unremarkable.  Musculoskeletal: Full ROM all peripheral extremities, joint stability, 5/5 strength, and normal gait.  Skin: Warm, dry without exposed rashes, lesions or ecchymosis apparent.  Neuro: Cranial nerves intact, reflexes equal bilaterally. Sensory-motor testing grossly intact. Tendon reflexes grossly intact.  Pysch: Alert & oriented x 3.  Insight and judgement nl & appropriate. No ideations.  Assessment and Plan:  1. Hypertension - Continue monitor blood pressure at home. Continue diet/meds same.  2. Hyperlipidemia - Continue diet/meds, exercise,& lifestyle modifications. Continue monitor periodic cholesterol/liver & renal functions   3. Pre-Diabetes - Continue diet, exercise, lifestyle modifications. Monitor appropriate labs.  4. Vitamin D Deficiency - Continue supplementation.  5. Testosterone Deficiency -   Recommended regular exercise, BP monitoring, weight control, and discussed med and SE's. Recommended labs to assess and monitor clinical status. Further disposition pending results of labs.

## 2014-07-20 LAB — HEPATIC FUNCTION PANEL
ALT: 38 U/L (ref 0–53)
AST: 28 U/L (ref 0–37)
Albumin: 4.2 g/dL (ref 3.5–5.2)
Alkaline Phosphatase: 73 U/L (ref 39–117)
Bilirubin, Direct: 0.2 mg/dL (ref 0.0–0.3)
Indirect Bilirubin: 0.7 mg/dL (ref 0.2–1.2)
Total Bilirubin: 0.9 mg/dL (ref 0.2–1.2)
Total Protein: 6.7 g/dL (ref 6.0–8.3)

## 2014-07-20 LAB — BASIC METABOLIC PANEL WITH GFR
BUN: 14 mg/dL (ref 6–23)
CALCIUM: 9.2 mg/dL (ref 8.4–10.5)
CHLORIDE: 101 meq/L (ref 96–112)
CO2: 27 mEq/L (ref 19–32)
Creat: 1.01 mg/dL (ref 0.50–1.35)
GFR, Est African American: >89 mL/min
GFR, Est Non African American: 86 mL/min
GLUCOSE: 89 mg/dL (ref 70–99)
Potassium: 4.2 mEq/L (ref 3.5–5.3)
SODIUM: 136 meq/L (ref 135–145)

## 2014-07-20 LAB — LIPID PANEL
Cholesterol: 132 mg/dL (ref 0–200)
HDL: 33 mg/dL — AB (ref >39)
LDL CALC: 62 mg/dL (ref 0–99)
Total CHOL/HDL Ratio: 4 Ratio
Triglycerides: 185 mg/dL — ABNORMAL HIGH (ref <150)
VLDL: 37 mg/dL (ref 0–40)

## 2014-07-20 LAB — HEMOGLOBIN A1C
Hgb A1c MFr Bld: 5.7 % — ABNORMAL HIGH (ref <5.7)
MEAN PLASMA GLUCOSE: 117 mg/dL — AB (ref <117)

## 2014-07-20 LAB — MAGNESIUM: MAGNESIUM: 2 mg/dL (ref 1.5–2.5)

## 2014-07-20 LAB — VITAMIN D 25 HYDROXY (VIT D DEFICIENCY, FRACTURES): Vit D, 25-Hydroxy: 68 ng/mL (ref 30–89)

## 2014-07-20 LAB — INSULIN, FASTING: Insulin fasting, serum: 18.4 u[IU]/mL (ref 2.0–19.6)

## 2014-07-20 LAB — TSH: TSH: 1.518 u[IU]/mL (ref 0.350–4.500)

## 2014-07-20 LAB — TESTOSTERONE: Testosterone: 247 ng/dL — ABNORMAL LOW (ref 300–890)

## 2014-08-16 ENCOUNTER — Ambulatory Visit (INDEPENDENT_AMBULATORY_CARE_PROVIDER_SITE_OTHER): Payer: 59 | Admitting: *Deleted

## 2014-08-16 DIAGNOSIS — Z23 Encounter for immunization: Secondary | ICD-10-CM

## 2014-09-05 ENCOUNTER — Other Ambulatory Visit: Payer: Self-pay | Admitting: Physician Assistant

## 2014-09-05 DIAGNOSIS — C439 Malignant melanoma of skin, unspecified: Secondary | ICD-10-CM

## 2014-09-05 DIAGNOSIS — D229 Melanocytic nevi, unspecified: Secondary | ICD-10-CM

## 2014-09-05 HISTORY — DX: Melanocytic nevi, unspecified: D22.9

## 2014-09-05 HISTORY — DX: Malignant melanoma of skin, unspecified: C43.9

## 2014-09-11 ENCOUNTER — Other Ambulatory Visit: Payer: Self-pay | Admitting: *Deleted

## 2014-09-11 ENCOUNTER — Other Ambulatory Visit: Payer: Self-pay | Admitting: Internal Medicine

## 2014-09-11 DIAGNOSIS — R42 Dizziness and giddiness: Secondary | ICD-10-CM

## 2014-09-11 DIAGNOSIS — I10 Essential (primary) hypertension: Secondary | ICD-10-CM

## 2014-09-11 DIAGNOSIS — R202 Paresthesia of skin: Secondary | ICD-10-CM

## 2014-09-11 DIAGNOSIS — C439 Malignant melanoma of skin, unspecified: Secondary | ICD-10-CM

## 2014-09-11 DIAGNOSIS — Z1212 Encounter for screening for malignant neoplasm of rectum: Secondary | ICD-10-CM

## 2014-09-11 DIAGNOSIS — C4330 Malignant melanoma of unspecified part of face: Secondary | ICD-10-CM

## 2014-09-11 DIAGNOSIS — R51 Headache: Principal | ICD-10-CM

## 2014-09-11 DIAGNOSIS — R200 Anesthesia of skin: Secondary | ICD-10-CM

## 2014-09-11 DIAGNOSIS — M542 Cervicalgia: Secondary | ICD-10-CM

## 2014-09-11 DIAGNOSIS — R519 Headache, unspecified: Secondary | ICD-10-CM

## 2014-09-12 ENCOUNTER — Other Ambulatory Visit: Payer: Self-pay | Admitting: *Deleted

## 2014-09-12 DIAGNOSIS — C439 Malignant melanoma of skin, unspecified: Secondary | ICD-10-CM

## 2014-09-12 DIAGNOSIS — R51 Headache: Secondary | ICD-10-CM

## 2014-09-12 DIAGNOSIS — R42 Dizziness and giddiness: Secondary | ICD-10-CM

## 2014-09-12 DIAGNOSIS — I10 Essential (primary) hypertension: Secondary | ICD-10-CM

## 2014-09-12 DIAGNOSIS — R519 Headache, unspecified: Secondary | ICD-10-CM

## 2014-09-16 ENCOUNTER — Other Ambulatory Visit: Payer: 59

## 2014-09-16 ENCOUNTER — Encounter: Payer: Self-pay | Admitting: Internal Medicine

## 2014-09-16 ENCOUNTER — Ambulatory Visit: Payer: 59 | Admitting: Internal Medicine

## 2014-09-16 VITALS — BP 122/78 | HR 88 | Temp 98.0°F | Resp 12 | Wt 230.0 lb

## 2014-09-16 DIAGNOSIS — Z85820 Personal history of malignant melanoma of skin: Secondary | ICD-10-CM | POA: Insufficient documentation

## 2014-09-16 DIAGNOSIS — C439 Malignant melanoma of skin, unspecified: Secondary | ICD-10-CM | POA: Insufficient documentation

## 2014-09-16 NOTE — Progress Notes (Signed)
   Subjective:    Patient ID: Kenneth Hoffman, male    DOB: 02-08-1966, 48 y.o.   MRN: [id number]  HPI Patient had a 4 mm medium brown mildly irregular pigmented lesion at the left temple that was bx'd at Dr Onalee Hua office with path returning Stage 2 Melanoma. Mohs surgery is scheduled for 23 Sep 2014. Patient has had intermittent HA waxing & waning over the last several months and also dizziness which more specifically is positional vertigo in nature.   Meds/Allergies revied Unchanged   Past Medical History  Diagnosis Date  . Hyperlipidemia   . Hypertension   . BPH (benign prostatic hyperplasia)   . Allergic rhinitis, cause unspecified    Past Surgical History  Procedure Laterality Date  . Colonoscopy  2008    Dr. Fuller Plan; family hx colon cancer in mother  . Vasectomy     Review of Systems Neg except as above  Objective:   Physical Exam   BP 122/78 mmHg  Pulse 88  Temp(Src) 98 F (36.7 C)  Resp 12  Wt 230 lb (104.327 kg)  Consultation Only today / No formal exam  Assessment & Plan:   1. Melanoma of skin  2. Headache  3. Vertigo  In light of the (+) biopsy , patient will be scheduled for CXR and MRI brain

## 2014-09-18 ENCOUNTER — Other Ambulatory Visit: Payer: Self-pay | Admitting: *Deleted

## 2014-09-18 DIAGNOSIS — I10 Essential (primary) hypertension: Secondary | ICD-10-CM

## 2014-09-19 ENCOUNTER — Other Ambulatory Visit: Payer: 59

## 2014-09-19 DIAGNOSIS — I10 Essential (primary) hypertension: Secondary | ICD-10-CM

## 2014-09-20 ENCOUNTER — Ambulatory Visit
Admission: RE | Admit: 2014-09-20 | Discharge: 2014-09-20 | Disposition: A | Discharge status: Home / Self Care | Payer: 59 | Source: Ambulatory Visit | Attending: Internal Medicine | Admitting: Internal Medicine

## 2014-09-20 DIAGNOSIS — R519 Headache, unspecified: Secondary | ICD-10-CM

## 2014-09-20 DIAGNOSIS — C439 Malignant melanoma of skin, unspecified: Secondary | ICD-10-CM

## 2014-09-20 DIAGNOSIS — R51 Headache: Secondary | ICD-10-CM

## 2014-09-20 DIAGNOSIS — R42 Dizziness and giddiness: Secondary | ICD-10-CM

## 2014-09-20 LAB — BASIC METABOLIC PANEL WITH GFR
BUN: 18 mg/dL (ref 6–23)
CHLORIDE: 100 meq/L (ref 96–112)
CO2: 24 mEq/L (ref 19–32)
CREATININE: 1.08 mg/dL (ref 0.50–1.35)
Calcium: 9.8 mg/dL (ref 8.4–10.5)
GFR, EST NON AFRICAN AMERICAN: 80 mL/min
Glucose, Bld: 106 mg/dL — ABNORMAL HIGH (ref 70–99)
Potassium: 4.1 mEq/L (ref 3.5–5.3)
Sodium: 139 mEq/L (ref 135–145)

## 2014-09-20 MED ORDER — GADOBENATE DIMEGLUMINE 529 MG/ML IV SOLN
20.0000 mL | Freq: Once | INTRAVENOUS | Status: AC | PRN
  Administered 2014-09-20: 20 mL via INTRAVENOUS

## 2014-09-27 ENCOUNTER — Other Ambulatory Visit: Payer: 59

## 2014-10-02 ENCOUNTER — Other Ambulatory Visit: Payer: Self-pay | Admitting: Physician Assistant

## 2014-10-18 ENCOUNTER — Ambulatory Visit: Payer: Self-pay | Admitting: Internal Medicine

## 2014-10-23 ENCOUNTER — Other Ambulatory Visit: Payer: Self-pay | Admitting: *Deleted

## 2014-10-23 MED ORDER — FEXOFENADINE-PSEUDOEPHED ER 180-240 MG PO TB24: 1.0000 | ORAL_TABLET | Freq: Every day | ORAL | Status: DC

## 2014-11-05 ENCOUNTER — Encounter: Payer: Self-pay | Admitting: Internal Medicine

## 2014-11-05 ENCOUNTER — Other Ambulatory Visit: Payer: Self-pay | Admitting: Internal Medicine

## 2014-11-05 ENCOUNTER — Ambulatory Visit (INDEPENDENT_AMBULATORY_CARE_PROVIDER_SITE_OTHER): Payer: 59 | Admitting: Internal Medicine

## 2014-11-05 VITALS — BP 134/88 | HR 80 | Temp 97.9°F | Resp 16 | Ht 74.0 in | Wt 239.4 lb

## 2014-11-05 DIAGNOSIS — R7309 Other abnormal glucose: Secondary | ICD-10-CM

## 2014-11-05 DIAGNOSIS — E785 Hyperlipidemia, unspecified: Secondary | ICD-10-CM

## 2014-11-05 DIAGNOSIS — E559 Vitamin D deficiency, unspecified: Secondary | ICD-10-CM

## 2014-11-05 DIAGNOSIS — I10 Essential (primary) hypertension: Secondary | ICD-10-CM

## 2014-11-05 DIAGNOSIS — E349 Endocrine disorder, unspecified: Secondary | ICD-10-CM

## 2014-11-05 DIAGNOSIS — Z79899 Other long term (current) drug therapy: Secondary | ICD-10-CM

## 2014-11-05 NOTE — Progress Notes (Signed)
Patient ID: Kenneth Hoffman, male   DOB: 05-20-1966, 48 y.o.   MRN: [id number]   This very nice 48 y.o.male presents for 3 month follow up with Hypertension, Hyperlipidemia, Pre-Diabetes and Vitamin D Deficiency.    Patient is treated for HTN & BP has been controlled at home. Today's BP: 134/88 mmHg. Patient has had no complaints of any cardiac type chest pain, palpitations, dyspnea/orthopnea/PND, dizziness, claudication, or dependent edema.   Hyperlipidemia is controlled with diet & meds. Patient denies myalgias or other med SE's. Last Lipids were at goal - Total Cholesterol,  132; HDL 33*; LDL 62; [id number] on  07/19/2014.   Also, the patient has history of  PreDiabetes and has had no symptoms of reactive hypoglycemia, diabetic polys, paresthesias or visual blurring.  Last A1c was 5.7% on 07/19/2014.   Patient has Testosterone Deficiency and last 2 levels were 201 & 247. Patient feels well off of therapy. Further, the patient also has history of Vitamin D Deficiency and supplements vitamin D without any suspected side-effects. Last vitamin D was 68 on   07/19/2014.    Medication List   CLARITIN-D 24 HOUR PO  Take by mouth daily.     fexofenadine-pseudoephedrine 180-240 MG per 24 hr tablet  Commonly known as:  ALLEGRA-D 24  Take 1 tablet by mouth daily.     lisinopril 10 MG tablet  Commonly known as:  PRINIVIL,ZESTRIL  Take 1 tablet (10 mg total) by mouth daily.     rosuvastatin 5 MG tablet  Commonly known as:  CRESTOR  Take 5 mg by mouth. Takes 5 mg Mon, Wed, and Fri     rosuvastatin 10 MG tablet  Commonly known as:  CRESTOR  Take 10 mg by mouth. Takes 10 mg Tues, Thurs, Sat, and Sun     tamsulosin 0.4 MG Caps capsule  Commonly known as:  FLOMAX  Take 1 capsule (0.4 mg total) by mouth daily.     Vitamin D 2000 UNITS Caps  Take by mouth. Takes 3 capsules daily to equal 6000 units daily     No Known Allergies  PMHx:   Past Medical History  Diagnosis Date  . Hyperlipidemia   .  Hypertension   . BPH (benign prostatic hyperplasia)   . Allergic rhinitis, cause unspecified    Immunization History  Administered Date(s) Administered  . Influenza Split 08/16/2014  . Influenza Whole 09/08/2012  . PPD Test 04/15/2014  . Pneumococcal-Unspecified 07/30/2009  . Td 04/25/2007   Past Surgical History  Procedure Laterality Date  . Colonoscopy  2008    Dr. Fuller Plan; family hx colon cancer in mother  . Vasectomy     FHx:    Reviewed / unchanged  SHx:    Reviewed / unchanged  Systems Review:  Constitutional: Denies fever, chills, wt changes, headaches, insomnia, fatigue, night sweats, change in appetite. Eyes: Denies redness, blurred vision, diplopia, discharge, itchy, watery eyes.  ENT: Denies discharge, congestion, post nasal drip, epistaxis, sore throat, earache, hearing loss, dental pain, tinnitus, vertigo, sinus pain, snoring.  CV: Denies chest pain, palpitations, irregular heartbeat, syncope, dyspnea, diaphoresis, orthopnea, PND, claudication or edema. Respiratory: denies cough, dyspnea, DOE, pleurisy, hoarseness, laryngitis, wheezing.  Gastrointestinal: Denies dysphagia, odynophagia, heartburn, reflux, water brash, abdominal pain or cramps, nausea, vomiting, bloating, diarrhea, constipation, hematemesis, melena, hematochezia  or hemorrhoids. Genitourinary: Denies dysuria, frequency, urgency, nocturia, hesitancy, discharge, hematuria or flank pain. Musculoskeletal: c/o pains witgh Rt knee esp after jogging.  Skin: Denies pruritus, rash, hives, warts, acne, eczema or change  in skin lesion(s). Neuro: No weakness, tremor, incoordination, spasms, paresthesia or pain. Psychiatric: Denies confusion, memory loss or sensory loss. Endo: Denies change in weight, skin or hair change.  Heme/Lymph: No excessive bleeding, bruising or enlarged lymph nodes.  Physical Exam  BP 134/88   Pulse 80  Temp 97.9 F   Resp 16  Ht 6\' 2"    Wt 239 lb 6.4 oz    BMI 30.72   Appears well  nourished and in no distress. Eyes: PERRLA, EOMs, conjunctiva no swelling or erythema. Sinuses: No frontal/maxillary tenderness ENT/Mouth: EAC's clear, TM's nl w/o erythema, bulging. Nares clear w/o erythema, swelling, exudates. Oropharynx clear without erythema or exudates. Oral hygiene is good. Tongue normal, non obstructing. Hearing intact.  Neck: Supple. Thyroid nl. Car 2+/2+ without bruits, nodes or JVD. Chest: Respirations nl with BS clear & equal w/o rales, rhonchi, wheezing or stridor.  Cor: Heart sounds normal w/ regular rate and rhythm without sig. murmurs, gallops, clicks, or rubs. Peripheral pulses normal and equal  without edema.  Abdomen: Soft & bowel sounds normal. Non-tender w/o guarding, rebound, hernias, masses, or organomegaly.  Lymphatics: Unremarkable.  Musculoskeletal: Full ROM all peripheral extremities, joint stability, 5/5 strength, and normal gait.  Skin: Warm, dry without exposed rashes, lesions or ecchymosis apparent.  Neuro: Cranial nerves intact, reflexes equal bilaterally. Sensory-motor testing grossly intact. Tendon reflexes grossly intact.  Pysch: Alert & oriented x 3.  Insight and judgement nl & appropriate. No ideations.  Assessment and Plan:  1. Hypertension - Continue monitor blood pressure at home. Continue diet/meds same.  2. Hyperlipidemia - Continue diet/meds, exercise,& lifestyle modifications. Continue monitor periodic cholesterol/liver & renal functions   3. Pre-Diabetes - Continue diet, exercise, lifestyle modifications. Monitor appropriate labs.  4. Vitamin D Deficiency - Continue supplementation.  5. Testosterone Deficiency - monitoring off therapy   Recommended regular exercise, BP monitoring, weight control, and discussed med and SE's. Recommended labs to assess and monitor clinical status. Further disposition pending results of labs.

## 2014-11-05 NOTE — Patient Instructions (Signed)

## 2014-11-06 LAB — INSULIN, FASTING: INSULIN FASTING, SERUM: 11.3 u[IU]/mL (ref 2.0–19.6)

## 2014-11-06 LAB — HEPATIC FUNCTION PANEL
ALBUMIN: 4.8 g/dL (ref 3.5–5.2)
ALT: 33 U/L (ref 0–53)
AST: 25 U/L (ref 0–37)
Alkaline Phosphatase: 75 U/L (ref 39–117)
Bilirubin, Direct: 0.2 mg/dL (ref 0.0–0.3)
Indirect Bilirubin: 0.8 mg/dL (ref 0.2–1.2)
TOTAL PROTEIN: 7.6 g/dL (ref 6.0–8.3)
Total Bilirubin: 1 mg/dL (ref 0.2–1.2)

## 2014-11-06 LAB — CBC WITH DIFFERENTIAL/PLATELET
BASOS ABS: 0 10*3/uL (ref 0.0–0.1)
BASOS PCT: 0 % (ref 0–1)
EOS PCT: 2 % (ref 0–5)
Eosinophils Absolute: 0.2 10*3/uL (ref 0.0–0.7)
HCT: 51.2 % (ref 39.0–52.0)
Hemoglobin: 17.4 g/dL — ABNORMAL HIGH (ref 13.0–17.0)
LYMPHS ABS: 1.7 10*3/uL (ref 0.7–4.0)
LYMPHS PCT: 22 % (ref 12–46)
MCH: 29.8 pg (ref 26.0–34.0)
MCHC: 34 g/dL (ref 30.0–36.0)
MCV: 87.7 fL (ref 78.0–100.0)
MPV: 10.4 fL (ref 8.6–12.4)
Monocytes Absolute: 0.6 10*3/uL (ref 0.1–1.0)
Monocytes Relative: 8 % (ref 3–12)
Neutro Abs: 5.3 10*3/uL (ref 1.7–7.7)
Neutrophils Relative %: 68 % (ref 43–77)
Platelets: 222 10*3/uL (ref 150–400)
RBC: 5.84 MIL/uL — AB (ref 4.22–5.81)
RDW: 13.5 % (ref 11.5–15.5)
WBC: 7.8 10*3/uL (ref 4.0–10.5)

## 2014-11-06 LAB — HEMOGLOBIN A1C
Hgb A1c MFr Bld: 5.6 % (ref <5.7)
MEAN PLASMA GLUCOSE: 114 mg/dL (ref <117)

## 2014-11-06 LAB — BASIC METABOLIC PANEL WITH GFR
BUN: 16 mg/dL (ref 6–23)
CHLORIDE: 102 meq/L (ref 96–112)
CO2: 24 mEq/L (ref 19–32)
Calcium: 9.9 mg/dL (ref 8.4–10.5)
Creat: 1.04 mg/dL (ref 0.50–1.35)
GFR, Est Non African American: 83 mL/min
Glucose, Bld: 90 mg/dL (ref 70–99)
POTASSIUM: 4.4 meq/L (ref 3.5–5.3)
Sodium: 138 mEq/L (ref 135–145)

## 2014-11-06 LAB — LIPID PANEL
CHOL/HDL RATIO: 4 ratio
CHOLESTEROL: 148 mg/dL (ref 0–200)
HDL: 37 mg/dL — ABNORMAL LOW (ref >39)
LDL CALC: 78 mg/dL (ref 0–99)
Triglycerides: 167 mg/dL — ABNORMAL HIGH (ref <150)
VLDL: 33 mg/dL (ref 0–40)

## 2014-11-06 LAB — TSH: TSH: 4.459 u[IU]/mL (ref 0.350–4.500)

## 2014-11-06 LAB — TESTOSTERONE: Testosterone: 198 ng/dL — ABNORMAL LOW (ref 300–890)

## 2014-11-06 LAB — VITAMIN D 25 HYDROXY (VIT D DEFICIENCY, FRACTURES): VIT D 25 HYDROXY: 57 ng/mL (ref 30–100)

## 2014-11-06 LAB — MAGNESIUM: Magnesium: 2 mg/dL (ref 1.5–2.5)

## 2014-11-07 ENCOUNTER — Other Ambulatory Visit: Payer: Self-pay | Admitting: Physician Assistant

## 2014-11-27 ENCOUNTER — Other Ambulatory Visit: Payer: Self-pay | Admitting: *Deleted

## 2014-12-10 ENCOUNTER — Other Ambulatory Visit: Payer: Self-pay | Admitting: *Deleted

## 2014-12-10 MED ORDER — TAMSULOSIN HCL 0.4 MG PO CAPS: 0.4000 mg | ORAL_CAPSULE | Freq: Every day | ORAL | Status: DC

## 2015-02-11 ENCOUNTER — Other Ambulatory Visit: Payer: Self-pay | Admitting: Physician Assistant

## 2015-02-13 ENCOUNTER — Encounter: Payer: Self-pay | Admitting: Internal Medicine

## 2015-02-13 ENCOUNTER — Ambulatory Visit: Payer: Self-pay | Admitting: Physician Assistant

## 2015-02-13 ENCOUNTER — Ambulatory Visit (INDEPENDENT_AMBULATORY_CARE_PROVIDER_SITE_OTHER): Payer: BLUE CROSS/BLUE SHIELD | Admitting: Internal Medicine

## 2015-02-13 VITALS — BP 110/64 | HR 90 | Temp 98.2°F | Resp 18 | Ht 73.0 in | Wt 225.0 lb

## 2015-02-13 DIAGNOSIS — E291 Testicular hypofunction: Secondary | ICD-10-CM

## 2015-02-13 DIAGNOSIS — E785 Hyperlipidemia, unspecified: Secondary | ICD-10-CM

## 2015-02-13 DIAGNOSIS — E559 Vitamin D deficiency, unspecified: Secondary | ICD-10-CM

## 2015-02-13 DIAGNOSIS — Z79899 Other long term (current) drug therapy: Secondary | ICD-10-CM

## 2015-02-13 DIAGNOSIS — R7309 Other abnormal glucose: Secondary | ICD-10-CM

## 2015-02-13 DIAGNOSIS — I10 Essential (primary) hypertension: Secondary | ICD-10-CM

## 2015-02-13 DIAGNOSIS — R739 Hyperglycemia, unspecified: Secondary | ICD-10-CM

## 2015-02-13 DIAGNOSIS — E349 Endocrine disorder, unspecified: Secondary | ICD-10-CM

## 2015-02-13 LAB — HEMOGLOBIN A1C
Hgb A1c MFr Bld: 5.6 % (ref <5.7)
Mean Plasma Glucose: 114 mg/dL (ref <117)

## 2015-02-13 LAB — CBC WITH DIFFERENTIAL/PLATELET
BASOS PCT: 1 % (ref 0–1)
Basophils Absolute: 0.1 10*3/uL (ref 0.0–0.1)
Eosinophils Absolute: 0.1 10*3/uL (ref 0.0–0.7)
Eosinophils Relative: 1 % (ref 0–5)
HCT: 48.4 % (ref 39.0–52.0)
Hemoglobin: 16.9 g/dL (ref 13.0–17.0)
Lymphocytes Relative: 28 % (ref 12–46)
Lymphs Abs: 1.6 10*3/uL (ref 0.7–4.0)
MCH: 29.9 pg (ref 26.0–34.0)
MCHC: 34.9 g/dL (ref 30.0–36.0)
MCV: 85.7 fL (ref 78.0–100.0)
MPV: 10.4 fL (ref 8.6–12.4)
Monocytes Absolute: 0.5 10*3/uL (ref 0.1–1.0)
Monocytes Relative: 8 % (ref 3–12)
Neutro Abs: 3.6 10*3/uL (ref 1.7–7.7)
Neutrophils Relative %: 62 % (ref 43–77)
PLATELETS: 243 10*3/uL (ref 150–400)
RBC: 5.65 MIL/uL (ref 4.22–5.81)
RDW: 13.1 % (ref 11.5–15.5)
WBC: 5.8 10*3/uL (ref 4.0–10.5)

## 2015-02-13 MED ORDER — ROSUVASTATIN CALCIUM 10 MG PO TABS: 10.0000 mg | ORAL_TABLET | Freq: Every day | ORAL | Status: DC

## 2015-02-13 MED ORDER — TAMSULOSIN HCL 0.4 MG PO CAPS: 0.4000 mg | ORAL_CAPSULE | Freq: Every day | ORAL | Status: DC

## 2015-02-13 NOTE — Progress Notes (Signed)
Patient ID: Kenneth Hoffman, male   DOB: [birth year], 49 y.o.   MRN: [id number]  Assessment and Plan:  Hypertension:  -Continue medication,  -monitor blood pressure at home.  -Continue DASH diet.   -Reminder to go to the ER if any CP, SOB, nausea, dizziness, severe HA, changes vision/speech, left arm numbness and tingling, and jaw pain.  Cholesterol: -Continue diet and exercise.  -Check cholesterol.   Pre-diabetes: -Continue diet and exercise.  -Check A1C  Vitamin D Def: -check level -continue medications.   Continue diet and meds as discussed. Further disposition pending results of labs.  HPI 49 y.o. male  presents for 3 month follow up with hypertension, hyperlipidemia, prediabetes and vitamin D.   His blood pressure has been controlled at home, today their BP is BP: 110/64 mmHg.   He does workout. He denies chest pain, shortness of breath, dizziness.   He is on cholesterol medication and denies myalgias. His cholesterol is at goal. The cholesterol last visit was:   Lab Results  Component Value Date   CHOL 148 11/05/2014   HDL 37* 11/05/2014   LDLCALC 78 11/05/2014   TRIG 167* 11/05/2014   CHOLHDL 4.0 11/05/2014     He has been working on diet and exercise for prediabetes, and denies foot ulcerations, hyperglycemia, hypoglycemia , increased appetite, nausea, paresthesia of the feet, polydipsia, polyuria, visual disturbances, vomiting and weight loss. Last A1C in the office was:  Lab Results  Component Value Date   HGBA1C 5.6 11/05/2014    Patient is on Vitamin D supplement.  Lab Results  Component Value Date   VD25OH 57 11/05/2014      Current Medications:  Current Outpatient Prescriptions on File Prior to Visit  Medication Sig Dispense Refill  . aspirin 81 MG tablet Take 81 mg by mouth daily.    . Cholecalciferol (VITAMIN D) 2000 UNITS CAPS Take by mouth. Takes 3 capsules daily to equal 6000 units daily    . fexofenadine-pseudoephedrine (ALLEGRA-D 24) 180-240 MG per  24 hr tablet Take 1 tablet by mouth daily. 30 tablet 6  . lisinopril (PRINIVIL,ZESTRIL) 10 MG tablet Take 1 tablet (10 mg total) by mouth daily. 90 tablet 1  . tamsulosin (FLOMAX) 0.4 MG CAPS capsule Take 1 capsule (0.4 mg total) by mouth daily. 30 capsule 6   No current facility-administered medications on file prior to visit.    Medical History:  Past Medical History  Diagnosis Date  . Hyperlipidemia   . Hypertension   . BPH (benign prostatic hyperplasia)   . Allergic rhinitis, cause unspecified     Allergies: No Known Allergies   Review of Systems:  Review of Systems  Constitutional: Negative for fever, chills and malaise/fatigue.  HENT: Negative for ear pain and tinnitus.   Eyes: Negative.   Respiratory: Negative for cough, sputum production, shortness of breath and wheezing.   Cardiovascular: Negative for chest pain, palpitations, leg swelling and PND.  Gastrointestinal: Negative for heartburn, nausea, vomiting, abdominal pain, diarrhea, constipation, blood in stool and melena.  Genitourinary: Negative for dysuria, urgency, frequency, hematuria and flank pain.  Musculoskeletal: Negative.   Skin: Negative.   Neurological: Negative for dizziness, tremors, sensory change, loss of consciousness and headaches.    Family history- Review and unchanged  Social history- Review and unchanged  Physical Exam: BP 110/64 mmHg  Pulse 90  Temp(Src) 98.2 F (36.8 C) (Temporal)  Resp 18  Ht 6\' 1"  (1.854 m)  Wt 225 lb (102.059 kg)  BMI 29.69 kg/m2 Wt Readings  from Last 3 Encounters:  02/13/15 225 lb (102.059 kg)  11/05/14 239 lb 6.4 oz (108.591 kg)  09/16/14 230 lb (104.327 kg)    General Appearance: Well nourished well developed, in no apparent distress. Eyes: PERRLA, EOMs, conjunctiva no swelling or erythema ENT/Mouth: Ear canals normal without obstruction, swelling, erythma, discharge.  TMs normal bilaterally.  Oropharynx moist, clear, without exudate, or postoropharyngeal  swelling.  Healing sunburn with some peeling and mild excoriation on the nose and also on the forehead.   Neck: Supple, thyroid normal,no cervical adenopathy  Respiratory: Respiratory effort normal, Breath sounds clear A&P without rhonchi, wheeze, or rale.  No retractions, no accessory usage. Cardio: RRR with no MRGs. Brisk peripheral pulses without edema.  Abdomen: Soft, + BS,  Non tender, no guarding, rebound, hernias, masses. Musculoskeletal: Full ROM, 5/5 strength, Normal gait Skin: Warm, dry without rashes, lesions, ecchymosis.  Neuro: Awake and oriented X 3, Cranial nerves intact. Normal muscle tone, no cerebellar symptoms. Psych: Normal affect, Insight and Judgment appropriate.    FORCUCCI, [name], PA-C 10:29 AM [facility]

## 2015-02-13 NOTE — Patient Instructions (Signed)

## 2015-02-14 LAB — HEPATIC FUNCTION PANEL
ALBUMIN: 4.7 g/dL (ref 3.5–5.2)
ALK PHOS: 69 U/L (ref 39–117)
ALT: 35 U/L (ref 0–53)
AST: 27 U/L (ref 0–37)
Bilirubin, Direct: 0.3 mg/dL (ref 0.0–0.3)
Indirect Bilirubin: 0.9 mg/dL (ref 0.2–1.2)
TOTAL PROTEIN: 7.2 g/dL (ref 6.0–8.3)
Total Bilirubin: 1.2 mg/dL (ref 0.2–1.2)

## 2015-02-14 LAB — LIPID PANEL
Cholesterol: 109 mg/dL (ref 0–200)
HDL: 31 mg/dL — AB (ref >=40)
LDL Cholesterol: 59 mg/dL (ref 0–99)
TRIGLYCERIDES: 93 mg/dL (ref <150)
Total CHOL/HDL Ratio: 3.5 Ratio
VLDL: 19 mg/dL (ref 0–40)

## 2015-02-14 LAB — BASIC METABOLIC PANEL WITH GFR
BUN: 17 mg/dL (ref 6–23)
CALCIUM: 9.9 mg/dL (ref 8.4–10.5)
CO2: 27 mEq/L (ref 19–32)
Chloride: 101 mEq/L (ref 96–112)
Creat: 1.05 mg/dL (ref 0.50–1.35)
GFR, Est Non African American: 82 mL/min
Glucose, Bld: 82 mg/dL (ref 70–99)
POTASSIUM: 4.3 meq/L (ref 3.5–5.3)
SODIUM: 138 meq/L (ref 135–145)

## 2015-02-14 LAB — INSULIN, FASTING: INSULIN FASTING, SERUM: 10.9 u[IU]/mL (ref 2.0–19.6)

## 2015-02-14 LAB — VITAMIN D 25 HYDROXY (VIT D DEFICIENCY, FRACTURES): Vit D, 25-Hydroxy: 59 ng/mL (ref 30–100)

## 2015-02-14 LAB — TSH: TSH: 1.693 u[IU]/mL (ref 0.350–4.500)

## 2015-02-14 LAB — MAGNESIUM: Magnesium: 1.8 mg/dL (ref 1.5–2.5)

## 2015-03-10 ENCOUNTER — Other Ambulatory Visit: Payer: Self-pay | Admitting: Emergency Medicine

## 2015-03-13 ENCOUNTER — Other Ambulatory Visit: Payer: Self-pay | Admitting: Physician Assistant

## 2015-04-04 ENCOUNTER — Other Ambulatory Visit: Payer: Self-pay | Admitting: *Deleted

## 2015-04-04 MED ORDER — LISINOPRIL 10 MG PO TABS: 10.0000 mg | ORAL_TABLET | Freq: Every day | ORAL | Status: DC

## 2015-05-01 ENCOUNTER — Encounter: Payer: Self-pay | Admitting: Internal Medicine

## 2015-05-21 ENCOUNTER — Ambulatory Visit: Payer: Self-pay | Admitting: Internal Medicine

## 2015-06-25 ENCOUNTER — Other Ambulatory Visit: Payer: Self-pay | Admitting: *Deleted

## 2015-06-25 MED ORDER — FEXOFENADINE-PSEUDOEPHED ER 180-240 MG PO TB24: 1.0000 | ORAL_TABLET | Freq: Every day | ORAL | Status: DC

## 2015-07-08 ENCOUNTER — Encounter: Payer: Self-pay | Admitting: Internal Medicine

## 2015-07-10 ENCOUNTER — Ambulatory Visit (INDEPENDENT_AMBULATORY_CARE_PROVIDER_SITE_OTHER): Payer: BLUE CROSS/BLUE SHIELD | Admitting: Internal Medicine

## 2015-07-10 ENCOUNTER — Encounter: Payer: Self-pay | Admitting: Internal Medicine

## 2015-07-10 VITALS — BP 108/74 | HR 68 | Temp 98.2°F | Resp 18 | Ht 74.0 in | Wt 200.0 lb

## 2015-07-10 DIAGNOSIS — I10 Essential (primary) hypertension: Secondary | ICD-10-CM

## 2015-07-10 DIAGNOSIS — Z0001 Encounter for general adult medical examination with abnormal findings: Secondary | ICD-10-CM

## 2015-07-10 DIAGNOSIS — R5383 Other fatigue: Secondary | ICD-10-CM

## 2015-07-10 DIAGNOSIS — Z1331 Encounter for screening for depression: Secondary | ICD-10-CM

## 2015-07-10 DIAGNOSIS — Z1389 Encounter for screening for other disorder: Secondary | ICD-10-CM | POA: Diagnosis not present

## 2015-07-10 DIAGNOSIS — Z9181 History of falling: Secondary | ICD-10-CM

## 2015-07-10 DIAGNOSIS — E559 Vitamin D deficiency, unspecified: Secondary | ICD-10-CM

## 2015-07-10 DIAGNOSIS — E349 Endocrine disorder, unspecified: Secondary | ICD-10-CM

## 2015-07-10 DIAGNOSIS — Z125 Encounter for screening for malignant neoplasm of prostate: Secondary | ICD-10-CM

## 2015-07-10 DIAGNOSIS — Z0000 Encounter for general adult medical examination without abnormal findings: Secondary | ICD-10-CM

## 2015-07-10 DIAGNOSIS — Z789 Other specified health status: Secondary | ICD-10-CM | POA: Diagnosis not present

## 2015-07-10 DIAGNOSIS — Z1212 Encounter for screening for malignant neoplasm of rectum: Secondary | ICD-10-CM

## 2015-07-10 DIAGNOSIS — R7309 Other abnormal glucose: Secondary | ICD-10-CM

## 2015-07-10 DIAGNOSIS — E785 Hyperlipidemia, unspecified: Secondary | ICD-10-CM

## 2015-07-10 DIAGNOSIS — Z79899 Other long term (current) drug therapy: Secondary | ICD-10-CM

## 2015-07-10 LAB — BASIC METABOLIC PANEL WITH GFR
BUN: 20 mg/dL (ref 7–25)
CO2: 30 mmol/L (ref 20–31)
Calcium: 10.1 mg/dL (ref 8.6–10.3)
Chloride: 102 mmol/L (ref 98–110)
Creat: 0.94 mg/dL (ref 0.70–1.33)
GFR, Est African American: >89 mL/min (ref >=60)
GLUCOSE: 93 mg/dL (ref 65–99)
POTASSIUM: 4.2 mmol/L (ref 3.5–5.3)
Sodium: 142 mmol/L (ref 135–146)

## 2015-07-10 LAB — LIPID PANEL
Cholesterol: 154 mg/dL (ref 125–200)
HDL: 39 mg/dL — ABNORMAL LOW (ref >=40)
LDL CALC: 103 mg/dL (ref <130)
Total CHOL/HDL Ratio: 3.9 Ratio (ref <=5.0)
Triglycerides: 59 mg/dL (ref <150)
VLDL: 12 mg/dL (ref <30)

## 2015-07-10 LAB — CBC WITH DIFFERENTIAL/PLATELET
Basophils Absolute: 0 10*3/uL (ref 0.0–0.1)
Basophils Relative: 1 % (ref 0–1)
Eosinophils Absolute: 0.1 10*3/uL (ref 0.0–0.7)
Eosinophils Relative: 2 % (ref 0–5)
HCT: 49.7 % (ref 39.0–52.0)
HEMOGLOBIN: 17 g/dL (ref 13.0–17.0)
Lymphocytes Relative: 40 % (ref 12–46)
Lymphs Abs: 1.6 10*3/uL (ref 0.7–4.0)
MCH: 30.2 pg (ref 26.0–34.0)
MCHC: 34.2 g/dL (ref 30.0–36.0)
MCV: 88.4 fL (ref 78.0–100.0)
MPV: 10.3 fL (ref 8.6–12.4)
Monocytes Absolute: 0.3 10*3/uL (ref 0.1–1.0)
Monocytes Relative: 8 % (ref 3–12)
Neutro Abs: 2 10*3/uL (ref 1.7–7.7)
Neutrophils Relative %: 49 % (ref 43–77)
Platelets: 213 10*3/uL (ref 150–400)
RBC: 5.62 MIL/uL (ref 4.22–5.81)
RDW: 13 % (ref 11.5–15.5)
WBC: 4 10*3/uL (ref 4.0–10.5)

## 2015-07-10 LAB — HEPATIC FUNCTION PANEL
ALK PHOS: 77 U/L (ref 40–115)
ALT: 27 U/L (ref 9–46)
AST: 22 U/L (ref 10–35)
Albumin: 4.7 g/dL (ref 3.6–5.1)
Bilirubin, Direct: 0.2 mg/dL (ref <=0.2)
Indirect Bilirubin: 0.8 mg/dL (ref 0.2–1.2)
TOTAL PROTEIN: 7.4 g/dL (ref 6.1–8.1)
Total Bilirubin: 1 mg/dL (ref 0.2–1.2)

## 2015-07-10 LAB — MAGNESIUM: Magnesium: 2.2 mg/dL (ref 1.5–2.5)

## 2015-07-10 LAB — TSH: TSH: 1.376 u[IU]/mL (ref 0.350–4.500)

## 2015-07-10 LAB — IRON AND TIBC
%SAT: 28 % (ref 15–60)
IRON: 110 ug/dL (ref 50–180)
TIBC: 393 ug/dL (ref 250–425)
UIBC: 283 ug/dL (ref 125–400)

## 2015-07-10 LAB — HEMOGLOBIN A1C
Hgb A1c MFr Bld: 5.3 % (ref <5.7)
MEAN PLASMA GLUCOSE: 105 mg/dL (ref <117)

## 2015-07-10 LAB — VITAMIN B12: VITAMIN B 12: 572 pg/mL (ref 211–911)

## 2015-07-10 NOTE — Progress Notes (Signed)
Patient ID: Kenneth Hoffman, male   DOB: 13-Sep-1966, 49 y.o.   MRN: [id number]  Complete Physical  Assessment and Plan:   1. Essential hypertension  - Urinalysis, Routine w reflex microscopic (not at [facility]) - Microalbumin / creatinine urine ratio - EKG 12-Lead - TSH  2. Hyperlipidemia  - Lipid panel  3. Abnormal glucose  - Hemoglobin A1c - Insulin, random  4. Vitamin D deficiency  - Vit D  25 hydroxy (rtn osteoporosis monitoring)  5. Testosterone deficiency - Testosterone  6. Medication management  - CBC with Differential/Platelet - BASIC METABOLIC PANEL WITH GFR - Hepatic function panel - Magnesium  7. Screening for prostate cancer  - PSA  8. Other fatigue  - Iron and TIBC - Vitamin B12  Discussed med's effects and SE's. Screening labs and tests as requested with regular follow-up as recommended.  HPI Patient presents for a complete physical.   His blood pressure has been controlled at home, today their BP is BP: 108/74 mmHg He does workout. He denies chest pain, shortness of breath, dizziness.  Patient reports that he has been running 3 miles 4 times per week.     He is on cholesterol medication and denies myalgias. His cholesterol is at goal. The cholesterol last visit was:   Lab Results  Component Value Date   CHOL 109 02/13/2015   HDL 31* 02/13/2015   LDLCALC 59 02/13/2015   TRIG 93 02/13/2015   CHOLHDL 3.5 02/13/2015    He has been working on diet and exercise for prediabetes, he is on bASA, he is on ACE/ARB and denies foot ulcerations, hyperglycemia, hypoglycemia , increased appetite, nausea, paresthesia of the feet, polydipsia, polyuria, visual disturbances, vomiting and weight loss. Last A1C in the office was:  Lab Results  Component Value Date   HGBA1C 5.6 02/13/2015    Patient is on Vitamin D supplement.   Lab Results  Component Value Date   VD25OH 59 02/13/2015      Last PSA was: Lab Results  Component Value Date   PSA 0.65 04/15/2014   .  Denies BPH symptoms daytime frequency, double voiding, dysuria, hematuria, hesitancy, incontinence, intermittency, nocturia, sensation of incomplete bladder emptying, suprapubic pain, urgency or weak urinary stream  .  Current Medications:  Current Outpatient Prescriptions on File Prior to Visit  Medication Sig Dispense Refill  . aspirin 81 MG tablet Take 81 mg by mouth daily.    . Cholecalciferol (VITAMIN D) 2000 UNITS CAPS Take by mouth. Takes 3 capsules daily to equal 6000 units daily    . fexofenadine-pseudoephedrine (ALLEGRA-D 24) 180-240 MG per 24 hr tablet Take 1 tablet by mouth daily. 30 tablet 6  . lisinopril (PRINIVIL,ZESTRIL) 10 MG tablet Take 1 tablet (10 mg total) by mouth daily. 90 tablet 3  . rosuvastatin (CRESTOR) 10 MG tablet Take 1 tablet (10 mg total) by mouth daily. 90 tablet 3  . tamsulosin (FLOMAX) 0.4 MG CAPS capsule TAKE 1 CAPSULE BY MOUTH DAILY 90 capsule 3   No current facility-administered medications on file prior to visit.    Health Maintenance:  Immunization History  Administered Date(s) Administered  . Influenza Split 08/16/2014  . Influenza Whole 09/08/2012  . PPD Test 04/15/2014  . Pneumococcal-Unspecified 07/30/2009  . Td 04/25/2007   Colonoscopy due in 2018   Patient Care Team: Unk Pinto, MD as PCP - General (Internal Medicine)  Allergies: No Known Allergies  Medical History:  Past Medical History  Diagnosis Date  . Hyperlipidemia   . Hypertension   .  BPH (benign prostatic hyperplasia)   . Allergic rhinitis, cause unspecified     Surgical History:  Past Surgical History  Procedure Laterality Date  . Colonoscopy  2008    Dr. Fuller Plan; family hx colon cancer in mother  . Vasectomy      Family History:  Family History  Problem Relation Age of Onset  . Colon cancer Mother 14  . Cancer Mother   . Diabetes Mother   . Kidney disease Mother   . COPD Mother   . Stomach cancer Neg Hx   . Cancer Father     Prostate, Lymphoma   . Hyperlipidemia Father   . Stroke Father   . Hyperlipidemia Brother     Social History:   Social History  Substance Use Topics  . Smoking status: Never Smoker   . Smokeless tobacco: Never Used  . Alcohol Use: Yes     Comment: rarely    Review of Systems:  Review of Systems  Constitutional: Negative for fever, chills and malaise/fatigue.  HENT: Negative for congestion, ear pain and sore throat.   Eyes: Negative.   Respiratory: Negative for cough, shortness of breath and wheezing.   Cardiovascular: Negative for chest pain, palpitations and leg swelling.  Gastrointestinal: Negative for heartburn, diarrhea, constipation, blood in stool and melena.  Genitourinary: Negative.   Musculoskeletal: Negative for myalgias and joint pain.  Skin: Negative.   Neurological: Negative for dizziness, sensory change, loss of consciousness and headaches.  Psychiatric/Behavioral: Negative for depression. The patient is not nervous/anxious and does not have insomnia.     Physical Exam: Estimated body mass index is 25.67 kg/(m^2) as calculated from the following:   Height as of this encounter: 6\' 2"  (1.88 m).   Weight as of this encounter: 200 lb (90.719 kg). BP 108/74 mmHg  Pulse 68  Temp(Src) 98.2 F (36.8 C) (Temporal)  Resp 18  Ht 6\' 2"  (1.88 m)  Wt 200 lb (90.719 kg)  BMI 25.67 kg/m2  General Appearance: Well nourished, in no apparent distress.  Eyes: PERRLA, EOMs, conjunctiva no swelling or erythema ENT/Mouth: Ear canals clear bilaterally with no erythema, swelling, discharge.  TMs normal bilaterally with no erythema, bulging, or retractions.  Oropharynx clear and moist with no exudate, swelling, or erythema.  Dentition normal.   Neck: Supple, thyroid normal. No bruits, JVD, cervical adenopathy Respiratory: Respiratory effort normal, BS equal bilaterally without rales, rhonchi, wheezing or stridor.  Cardio: RRR without murmurs, rubs or gallops. Brisk peripheral pulses without edema.   Chest: symmetric, with normal excursions Abdomen: Soft, nontender, no guarding, rebound, hernias, masses, or organomegaly. Genitourinary: Prostate non-tender non-body.  No nodules or abnormalities.   Musculoskeletal: Full ROM all peripheral extremities,5/5 strength, and normal gait.  Skin: Warm, dry without rashes, lesions, ecchymosis. Neuro: A&Ox3, Cranial nerves intact, reflexes equal bilaterally. Normal muscle tone, no cerebellar symptoms. Sensation intact.  Psych: Normal affect, Insight and Judgment appropriate.   EKG: WNL no changes.  Over 40 minutes of exam, counseling, chart review and critical decision making was performed  Starlyn Skeans 9:32 AM [facility]

## 2015-07-10 NOTE — Patient Instructions (Signed)

## 2015-07-11 LAB — PSA: PSA: 0.91 ng/mL (ref <=4.00)

## 2015-07-11 LAB — URINALYSIS, ROUTINE W REFLEX MICROSCOPIC
Bilirubin Urine: NEGATIVE
GLUCOSE, UA: NEGATIVE
HGB URINE DIPSTICK: NEGATIVE
Ketones, ur: NEGATIVE
LEUKOCYTES UA: NEGATIVE
Nitrite: NEGATIVE
PH: 7 (ref 5.0–8.0)
Protein, ur: NEGATIVE
SPECIFIC GRAVITY, URINE: 1.016 (ref 1.001–1.035)

## 2015-07-11 LAB — MICROALBUMIN / CREATININE URINE RATIO
Creatinine, Urine: 100.5 mg/dL
MICROALB/CREAT RATIO: 4 mg/g (ref 0.0–30.0)
Microalb, Ur: 0.4 mg/dL (ref <2.0)

## 2015-07-11 LAB — TESTOSTERONE: Testosterone: 286 ng/dL — ABNORMAL LOW (ref 300–890)

## 2015-07-11 LAB — VITAMIN D 25 HYDROXY (VIT D DEFICIENCY, FRACTURES): Vit D, 25-Hydroxy: 90 ng/mL (ref 30–100)

## 2015-07-11 LAB — INSULIN, RANDOM: Insulin: 6.3 u[IU]/mL (ref 2.0–19.6)

## 2015-12-26 DIAGNOSIS — M6702 Short Achilles tendon (acquired), left ankle: Secondary | ICD-10-CM | POA: Insufficient documentation

## 2015-12-26 DIAGNOSIS — M722 Plantar fascial fibromatosis: Secondary | ICD-10-CM | POA: Insufficient documentation

## 2016-01-07 ENCOUNTER — Ambulatory Visit: Payer: Self-pay | Admitting: Internal Medicine

## 2016-01-09 ENCOUNTER — Ambulatory Visit (INDEPENDENT_AMBULATORY_CARE_PROVIDER_SITE_OTHER): Payer: BLUE CROSS/BLUE SHIELD | Admitting: Internal Medicine

## 2016-01-09 ENCOUNTER — Encounter: Payer: Self-pay | Admitting: Internal Medicine

## 2016-01-09 VITALS — BP 118/80 | HR 86 | Temp 98.0°F | Resp 18 | Ht 74.0 in | Wt 216.0 lb

## 2016-01-09 DIAGNOSIS — I10 Essential (primary) hypertension: Secondary | ICD-10-CM

## 2016-01-09 DIAGNOSIS — R7309 Other abnormal glucose: Secondary | ICD-10-CM

## 2016-01-09 DIAGNOSIS — Z79899 Other long term (current) drug therapy: Secondary | ICD-10-CM | POA: Diagnosis not present

## 2016-01-09 DIAGNOSIS — E559 Vitamin D deficiency, unspecified: Secondary | ICD-10-CM | POA: Diagnosis not present

## 2016-01-09 DIAGNOSIS — E785 Hyperlipidemia, unspecified: Secondary | ICD-10-CM

## 2016-01-09 LAB — CBC WITH DIFFERENTIAL/PLATELET
BASOS ABS: 0 10*3/uL (ref 0.0–0.1)
Basophils Relative: 0 % (ref 0–1)
EOS PCT: 2 % (ref 0–5)
Eosinophils Absolute: 0.1 10*3/uL (ref 0.0–0.7)
HEMATOCRIT: 45 % (ref 39.0–52.0)
Hemoglobin: 16.1 g/dL (ref 13.0–17.0)
LYMPHS ABS: 1.4 10*3/uL (ref 0.7–4.0)
LYMPHS PCT: 27 % (ref 12–46)
MCH: 31.1 pg (ref 26.0–34.0)
MCHC: 35.8 g/dL (ref 30.0–36.0)
MCV: 87 fL (ref 78.0–100.0)
MONO ABS: 0.4 10*3/uL (ref 0.1–1.0)
MPV: 9.6 fL (ref 8.6–12.4)
Monocytes Relative: 7 % (ref 3–12)
Neutro Abs: 3.3 10*3/uL (ref 1.7–7.7)
Neutrophils Relative %: 64 % (ref 43–77)
Platelets: 182 10*3/uL (ref 150–400)
RBC: 5.17 MIL/uL (ref 4.22–5.81)
RDW: 13.6 % (ref 11.5–15.5)
WBC: 5.2 10*3/uL (ref 4.0–10.5)

## 2016-01-09 LAB — HEPATIC FUNCTION PANEL
ALBUMIN: 4.5 g/dL (ref 3.6–5.1)
ALK PHOS: 63 U/L (ref 40–115)
ALT: 29 U/L (ref 9–46)
AST: 25 U/L (ref 10–35)
Bilirubin, Direct: 0.1 mg/dL (ref <=0.2)
Indirect Bilirubin: 0.7 mg/dL (ref 0.2–1.2)
TOTAL PROTEIN: 6.9 g/dL (ref 6.1–8.1)
Total Bilirubin: 0.8 mg/dL (ref 0.2–1.2)

## 2016-01-09 LAB — BASIC METABOLIC PANEL WITH GFR
BUN: 18 mg/dL (ref 7–25)
CO2: 27 mmol/L (ref 20–31)
Calcium: 9.4 mg/dL (ref 8.6–10.3)
Chloride: 105 mmol/L (ref 98–110)
Creat: 1.05 mg/dL (ref 0.70–1.33)
GFR, Est Non African American: 82 mL/min (ref >=60)
GLUCOSE: 98 mg/dL (ref 65–99)
POTASSIUM: 4.3 mmol/L (ref 3.5–5.3)
Sodium: 139 mmol/L (ref 135–146)

## 2016-01-09 LAB — HEMOGLOBIN A1C
HEMOGLOBIN A1C: 5.3 % (ref <5.7)
MEAN PLASMA GLUCOSE: 105 mg/dL (ref <117)

## 2016-01-09 LAB — LIPID PANEL
Cholesterol: 179 mg/dL (ref 125–200)
HDL: 42 mg/dL (ref >=40)
LDL Cholesterol: 96 mg/dL (ref <130)
Total CHOL/HDL Ratio: 4.3 Ratio (ref <=5.0)
Triglycerides: 205 mg/dL — ABNORMAL HIGH (ref <150)
VLDL: 41 mg/dL — ABNORMAL HIGH (ref <30)

## 2016-01-09 LAB — TSH: TSH: 1.46 m[IU]/L (ref 0.40–4.50)

## 2016-01-09 MED ORDER — FEXOFENADINE-PSEUDOEPHED ER 180-240 MG PO TB24: 1.0000 | ORAL_TABLET | Freq: Every day | ORAL | Status: DC

## 2016-01-09 MED ORDER — TAMSULOSIN HCL 0.4 MG PO CAPS: 0.4000 mg | ORAL_CAPSULE | Freq: Every day | ORAL | Status: DC

## 2016-01-09 NOTE — Progress Notes (Signed)
Assessment and Plan:  Hypertension:  -Continue medication,  -monitor blood pressure at home.  -Continue DASH diet.   -Reminder to go to the ER if any CP, SOB, nausea, dizziness, severe HA, changes vision/speech, left arm numbness and tingling, and jaw pain.  Cholesterol: -Continue diet and exercise.  -Check cholesterol   Pre-diabetes: -Continue diet and exercise.  -Check A1C  Vitamin D Def: -continue medications.   BPH -refilled flomax  Continue diet and meds as discussed. Further disposition pending results of labs.  HPI 50 y.o. male  presents for 3 month follow up with hypertension, hyperlipidemia, prediabetes and vitamin D.   His blood pressure has been controlled at home, today their BP is BP: 118/80 mmHg.   He does workout. He denies chest pain, shortness of breath, dizziness.   He is on cholesterol medication and denies myalgias. His cholesterol is at goal. The cholesterol last visit was:   Lab Results  Component Value Date   CHOL 154 07/10/2015   HDL 39* 07/10/2015   LDLCALC 103 07/10/2015   TRIG 59 07/10/2015   CHOLHDL 3.9 07/10/2015     He has been working on diet and exercise for prediabetes, and denies foot ulcerations, hyperglycemia, hypoglycemia , increased appetite, nausea, paresthesia of the feet, polydipsia, polyuria, visual disturbances, vomiting and weight loss. Last A1C in the office was:  Lab Results  Component Value Date   HGBA1C 5.3 07/10/2015    Patient is on Vitamin D supplement.  Lab Results  Component Value Date   VD25OH 90 07/10/2015        Current Medications:  Current Outpatient Prescriptions on File Prior to Visit  Medication Sig Dispense Refill  . aspirin 81 MG tablet Take 81 mg by mouth daily.    . Cholecalciferol (VITAMIN D) 2000 UNITS CAPS Take by mouth. Takes 3 capsules daily to equal 6000 units daily    . fexofenadine-pseudoephedrine (ALLEGRA-D 24) 180-240 MG per 24 hr tablet Take 1 tablet by mouth daily. 30 tablet 6  .  rosuvastatin (CRESTOR) 10 MG tablet Take 1 tablet (10 mg total) by mouth daily. 90 tablet 3  . tamsulosin (FLOMAX) 0.4 MG CAPS capsule TAKE 1 CAPSULE BY MOUTH DAILY 90 capsule 3  . lisinopril (PRINIVIL,ZESTRIL) 10 MG tablet Take 1 tablet (10 mg total) by mouth daily. (Patient not taking: Reported on 01/09/2016) 90 tablet 3   No current facility-administered medications on file prior to visit.    Medical History:  Past Medical History  Diagnosis Date  . Hyperlipidemia   . Hypertension   . BPH (benign prostatic hyperplasia)   . Allergic rhinitis, cause unspecified     Allergies: No Known Allergies   Review of Systems:  ROS  Family history- Review and unchanged  Social history- Review and unchanged  Physical Exam: BP 118/80 mmHg  Pulse 86  Temp(Src) 98 F (36.7 C) (Temporal)  Resp 18  Ht 6\' 2"  (1.88 m)  Wt 216 lb (97.977 kg)  BMI 27.72 kg/m2 Wt Readings from Last 3 Encounters:  01/09/16 216 lb (97.977 kg)  07/10/15 200 lb (90.719 kg)  02/13/15 225 lb (102.059 kg)    General Appearance: Well nourished well developed, in no apparent distress. Eyes: PERRLA, EOMs, conjunctiva no swelling or erythema ENT/Mouth: Ear canals normal without obstruction, swelling, erythma, discharge.  TMs normal bilaterally.  Oropharynx moist, clear, without exudate, or postoropharyngeal swelling. Neck: Supple, thyroid normal,no cervical adenopathy  Respiratory: Respiratory effort normal, Breath sounds clear A&P without rhonchi, wheeze, or rale.  No  retractions, no accessory usage. Cardio: RRR with no MRGs. Brisk peripheral pulses without edema.  Abdomen: Soft, + BS,  Non tender, no guarding, rebound, hernias, masses. Musculoskeletal: Full ROM, 5/5 strength, Normal gait Skin: Warm, dry without rashes, lesions, ecchymosis.  Neuro: Awake and oriented X 3, Cranial nerves intact. Normal muscle tone, no cerebellar symptoms. Psych: Normal affect, Insight and Judgment appropriate.    Starlyn Skeans,  PA-C 9:15 AM [facility]

## 2016-02-07 ENCOUNTER — Other Ambulatory Visit: Payer: Self-pay | Admitting: Internal Medicine

## 2016-02-12 ENCOUNTER — Other Ambulatory Visit: Payer: Self-pay | Admitting: Internal Medicine

## 2016-02-23 ENCOUNTER — Other Ambulatory Visit: Payer: Self-pay | Admitting: Internal Medicine

## 2016-03-11 ENCOUNTER — Other Ambulatory Visit: Payer: Self-pay | Admitting: Internal Medicine

## 2016-03-11 MED ORDER — TAMSULOSIN HCL 0.4 MG PO CAPS: 0.4000 mg | ORAL_CAPSULE | Freq: Every day | ORAL | Status: DC

## 2016-04-06 ENCOUNTER — Other Ambulatory Visit: Payer: Self-pay | Admitting: Internal Medicine

## 2016-04-08 IMAGING — MR MR HEAD WO/W CM
7 of 11 series · 26 of 48 positions shown · IV contrast (Yes)
Comparison: None.

CLINICAL DATA: New diagnosis of melanoma of the left temple 3 weeks
ago. Dizziness and occipital headaches of 2-3 months duration.

EXAM:
MRI HEAD WITHOUT AND WITH CONTRAST
TECHNIQUE: Multiplanar, multiecho pulse sequences of the brain and surrounding
structures were obtained without and with intravenous contrast.
CONTRAST:  20mL MULTIHANCE GADOBENATE DIMEGLUMINE 529 MG/ML IV SOLN

[Series 3: FLAIR · sagittal · 5.0mm · 0.47mm/px · 2 of 30 slices shown (1 of 3)]
[im 1/30]
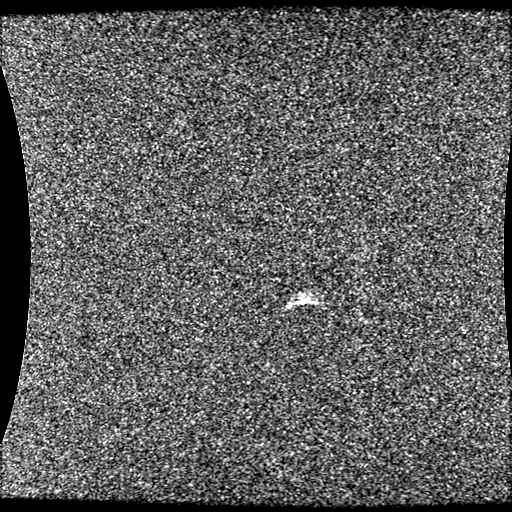
[im 30/30]
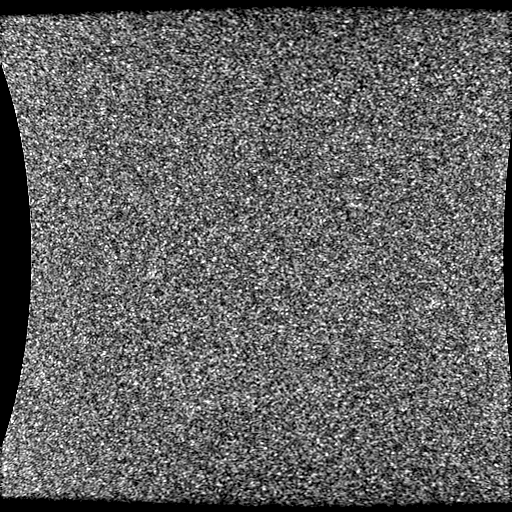

[Series 4: DWI · axial · 5.0mm · 1.09mm/px · z∈[-35,+144]mm · 7 of 68 slices shown (1 of 2)]
[im 1/68]
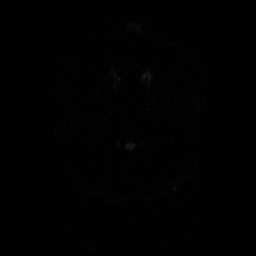
[im 12/68]
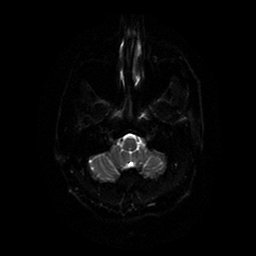
[im 23/68]
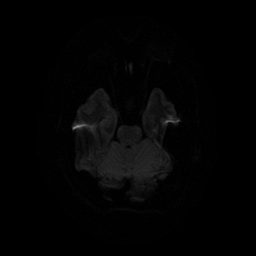
[im 34/68]
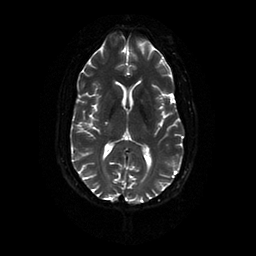
[im 45/68]
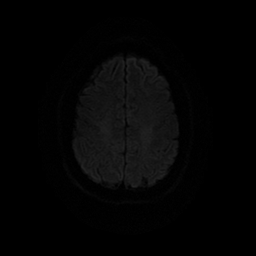
[im 56/68]
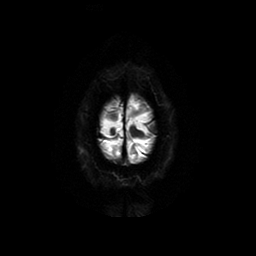
[im 68/68]
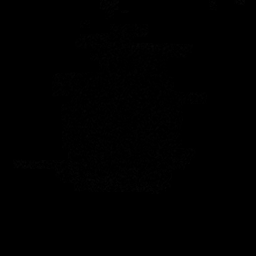

[Series 7: FLAIR · axial · 5.0mm · 0.43mm/px · z∈[-35,+124]mm · 3 of 28 slices shown (2 of 3)]
[im 1/28]
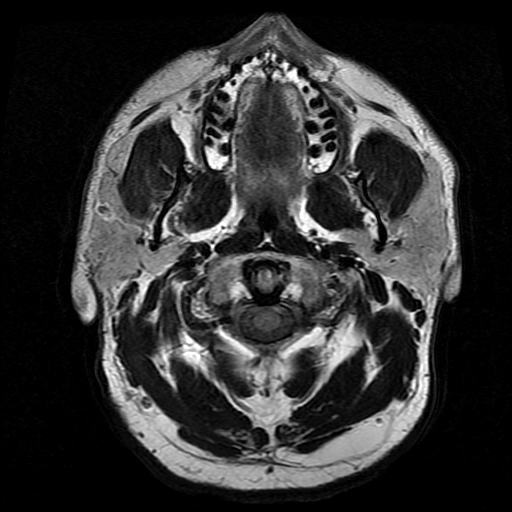
[im 14/28]
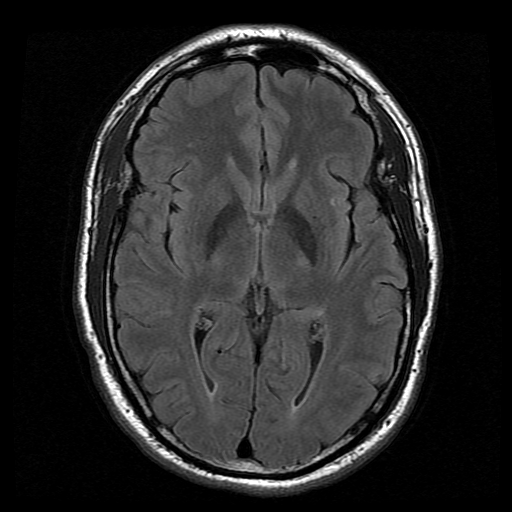
[im 28/28]
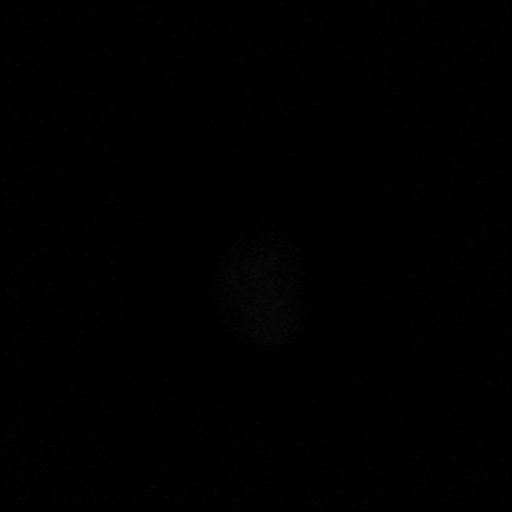

[Series 9: T2 post-contrast · coronal · 5.0mm · 0.43mm/px · 4 of 36 slices shown]
[im 1/36]
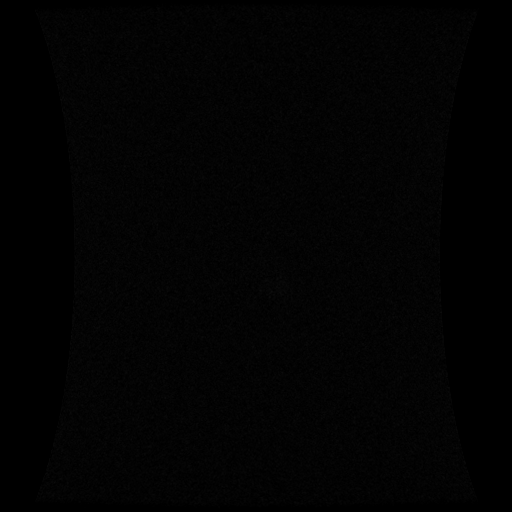
[im 12/36]
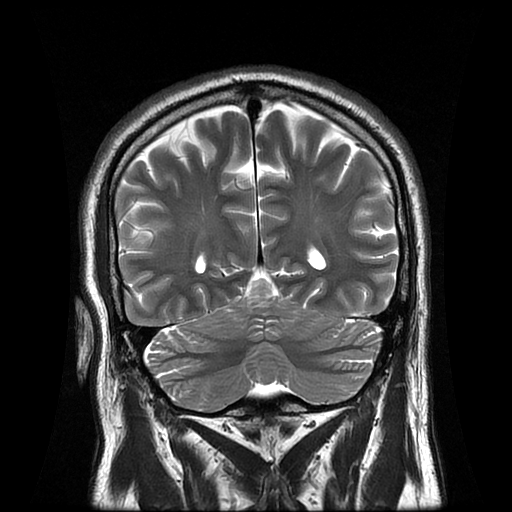
[im 24/36]
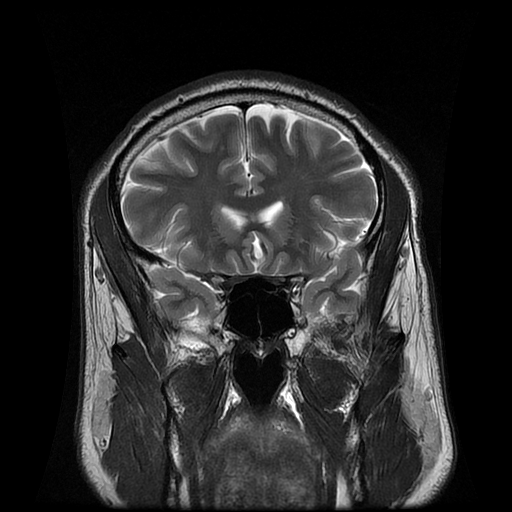
[im 36/36]
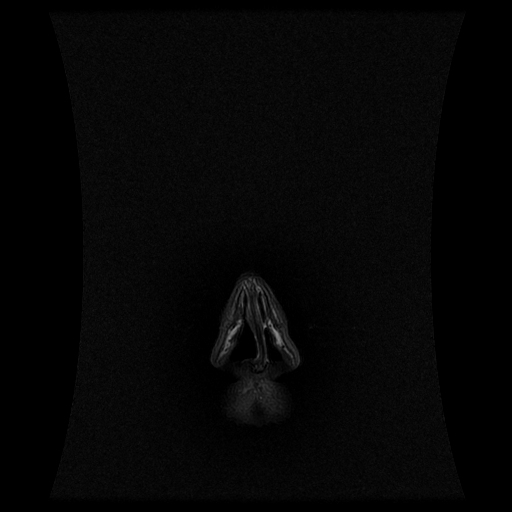

[Series 11: T1 · coronal · 5.0mm · 0.43mm/px · 4 of 36 slices shown]
[im 1/36]
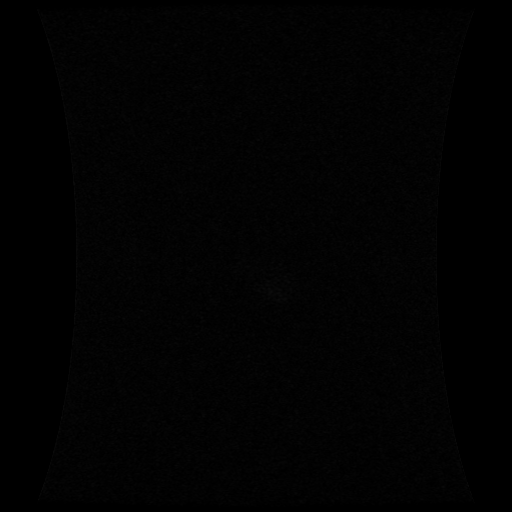
[im 12/36]
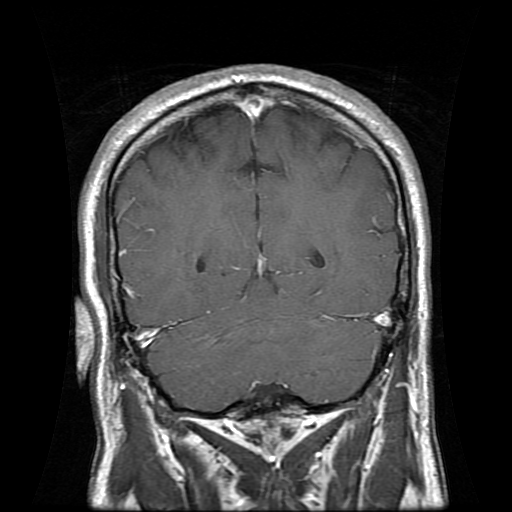
[im 24/36]
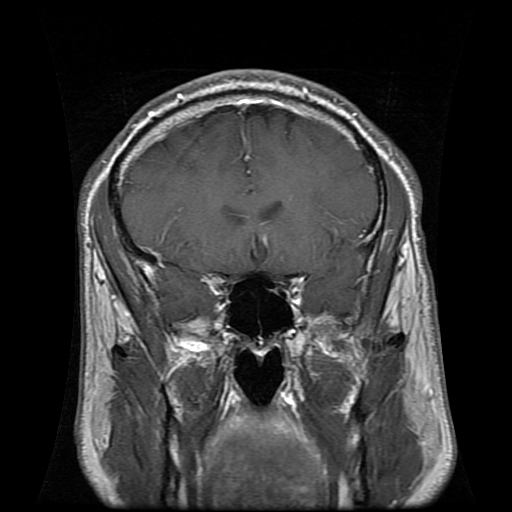
[im 36/36]
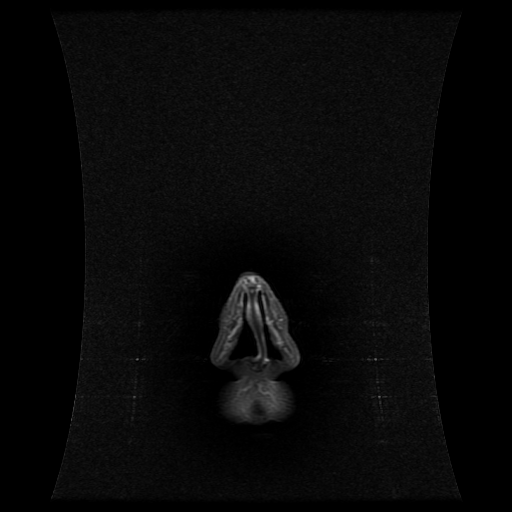

[Series 12: FLAIR · sagittal · 5.0mm · 0.47mm/px · 3 of 30 slices shown (3 of 3)]
[im 1/30]
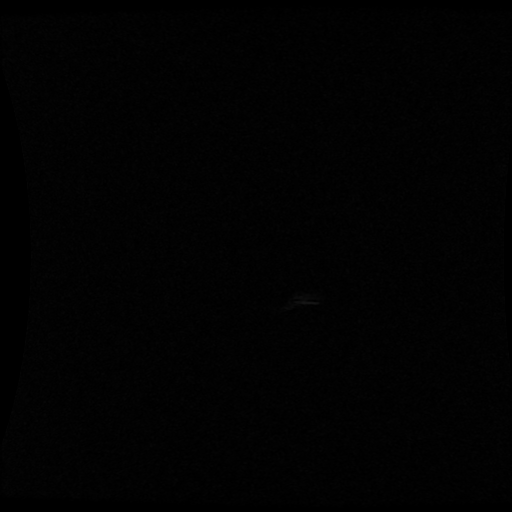
[im 15/30]
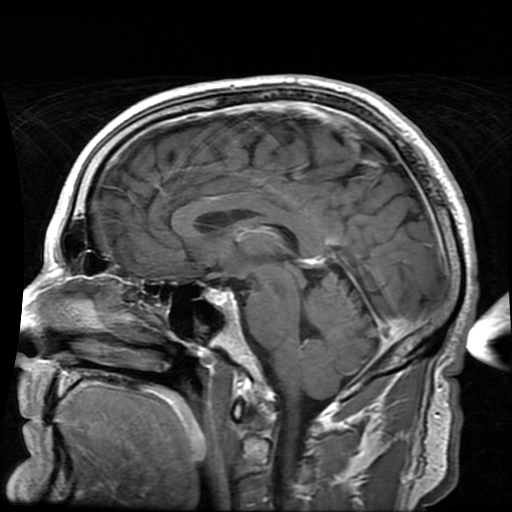
[im 30/30]
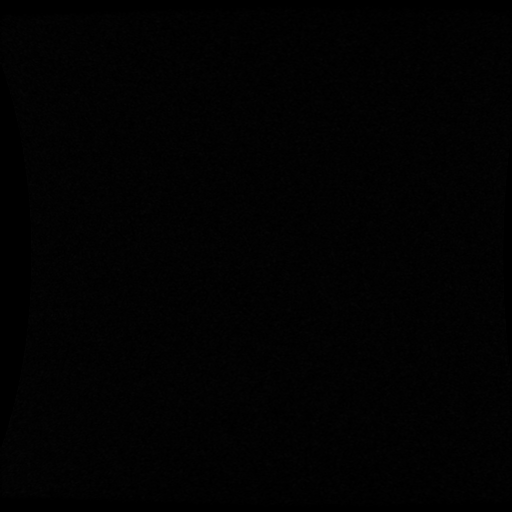

[Series 400: DWI · axial · 5.0mm · 1.09mm/px · z∈[-35,+128]mm · 3 of 31 slices shown (2 of 2)]
[im 1/31]
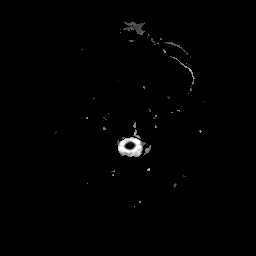
[im 16/31]
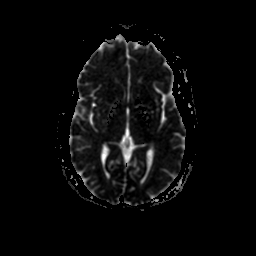
[im 31/31]
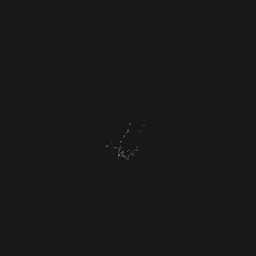

[26 of 48 positions shown; findings below may reference images not displayed]

FINDINGS: Diffusion imaging does not show any acute or subacute infarction.
The brainstem and cerebellum are normal. There is an old lacunar
infarction in the right basal ganglia. There are mild chronic
small-vessel changes of the cerebral hemispheric white matter. No
cortical or large vessel territory infarction. No mass lesion,
hemorrhage, hydrocephalus or extra-axial collection. Mild
postsurgical edema is evident in the left temporal subcutaneous
region. After contrast administration, no abnormal enhancement
occurs. No pituitary mass. No inflammatory sinus disease. No skull
or skullbase lesion.
IMPRESSION: No acute finding.  No evidence of metastatic disease.

Mild chronic small-vessel ischemic changes in the brain as outlined
above.

## 2016-04-08 IMAGING — CR DG CHEST 2V
2 series · 2 of 2 positions shown · non-contrast
Comparison: None.

CLINICAL DATA: 48-year-old male with a history of melanoma. No
current chest complaints. No smoking history.

EXAM:
CHEST - 2 VIEW

[view not recorded (1 of 2)]
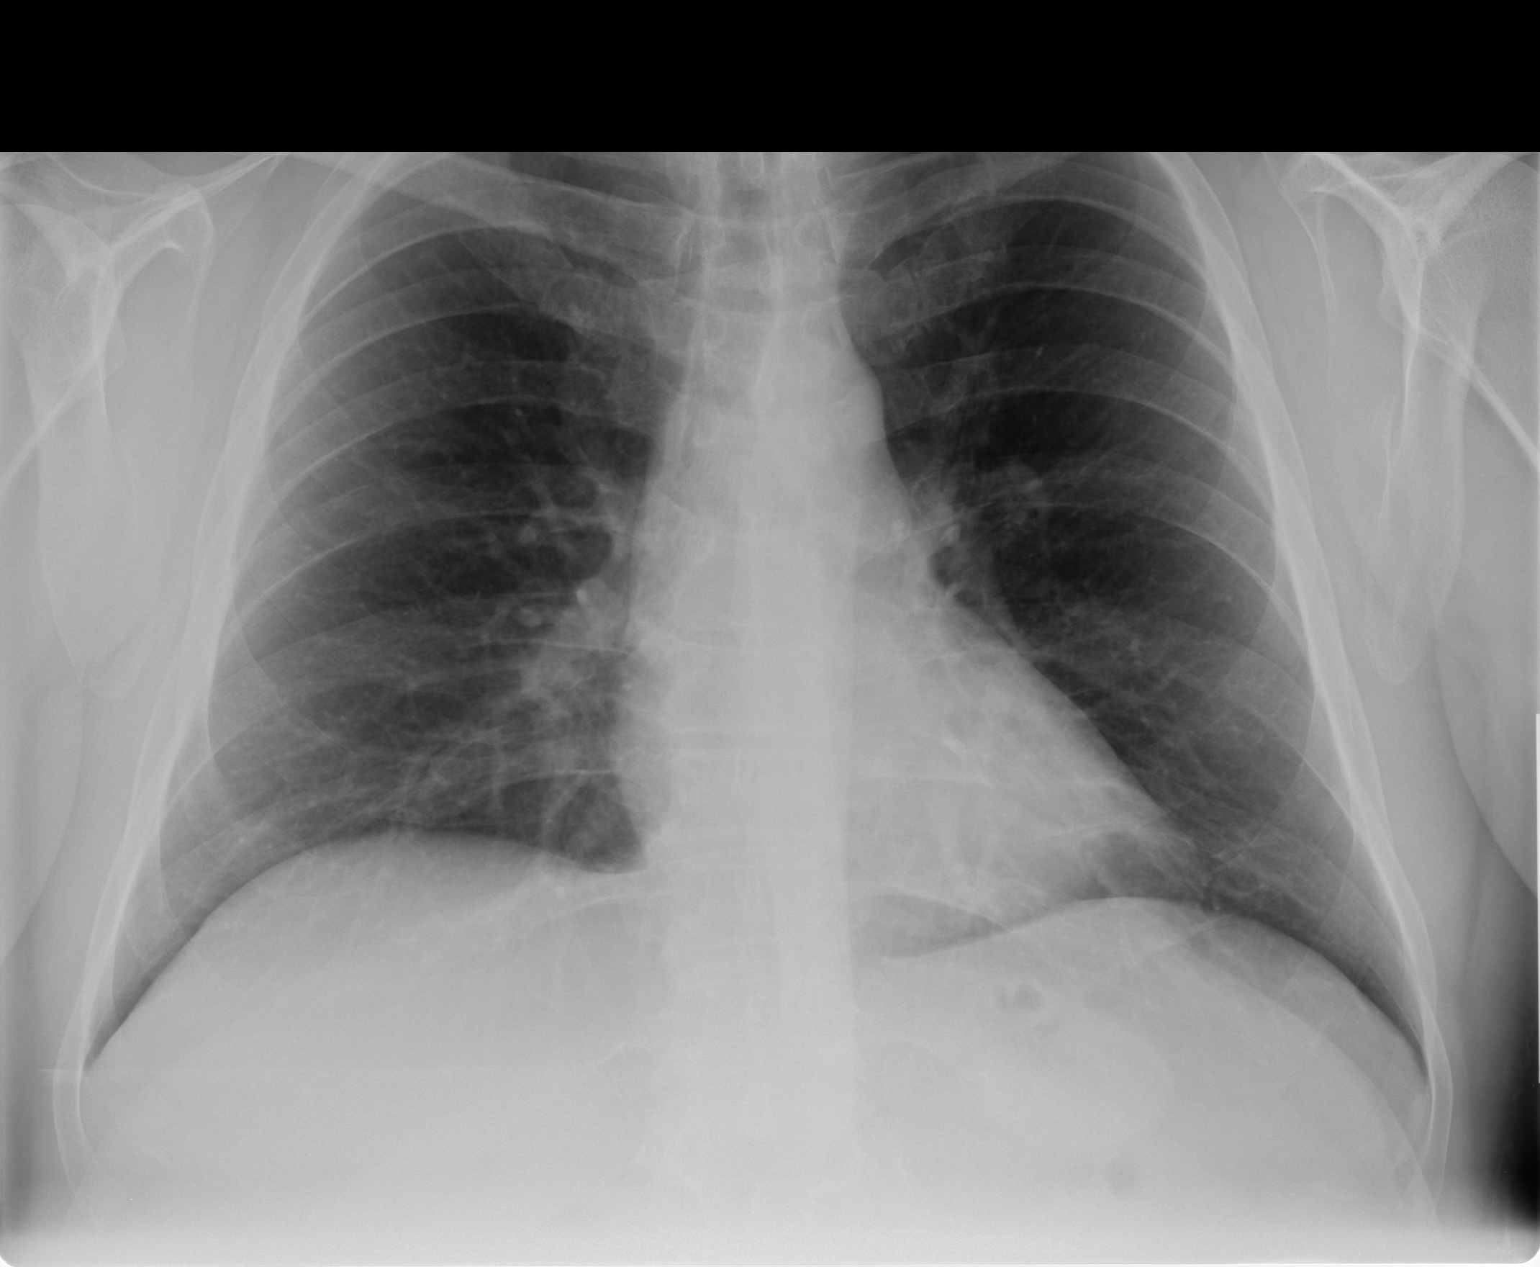

[view not recorded (2 of 2)]
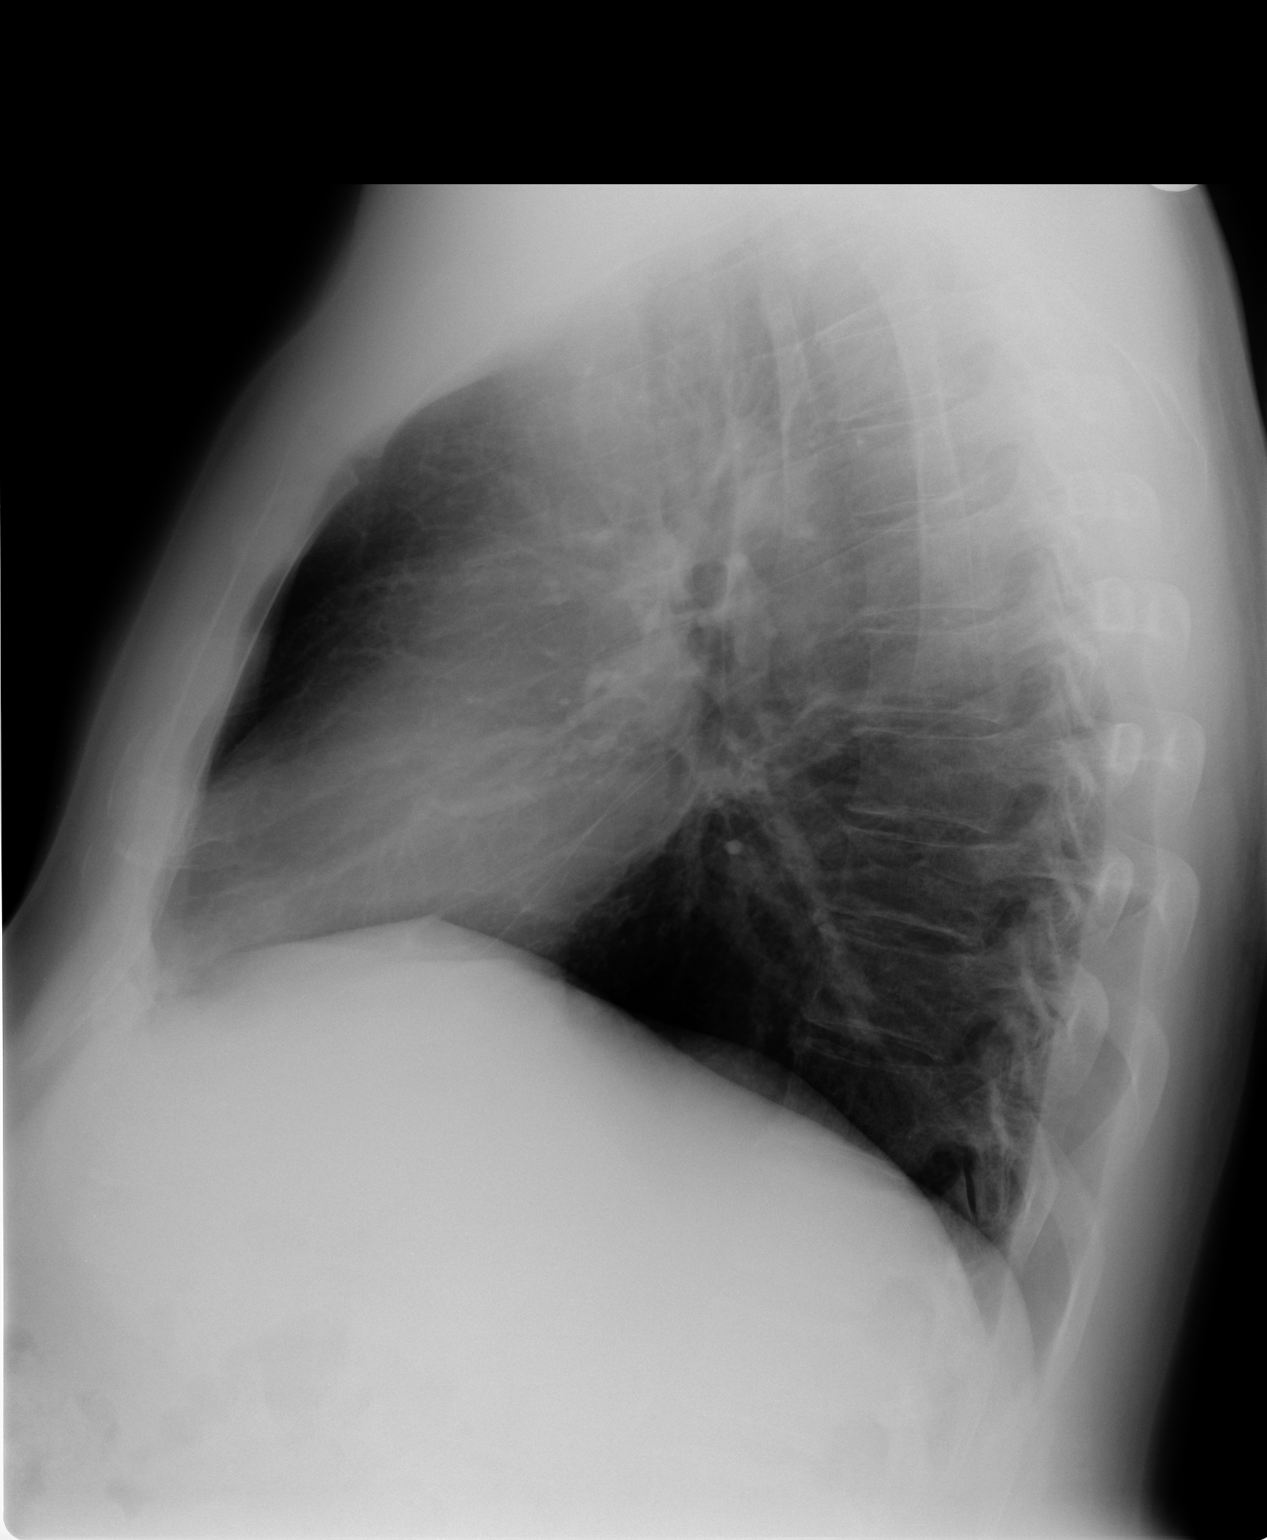

[2 of 2 positions shown; findings below may reference images not displayed]

FINDINGS: Cardiomediastinal silhouette projects within normal limits in size
and contour. No confluent airspace disease, pneumothorax, or pleural
effusion.

No displaced fracture.

Unremarkable appearance of the upper abdomen.
IMPRESSION: No radiographic evidence of acute cardiopulmonary disease.

## 2016-04-23 ENCOUNTER — Ambulatory Visit (INDEPENDENT_AMBULATORY_CARE_PROVIDER_SITE_OTHER): Payer: BLUE CROSS/BLUE SHIELD | Admitting: Internal Medicine

## 2016-04-23 VITALS — BP 122/80 | HR 90 | Temp 98.0°F | Resp 16 | Ht 74.0 in | Wt 219.0 lb

## 2016-04-23 DIAGNOSIS — R7309 Other abnormal glucose: Secondary | ICD-10-CM

## 2016-04-23 DIAGNOSIS — E559 Vitamin D deficiency, unspecified: Secondary | ICD-10-CM | POA: Diagnosis not present

## 2016-04-23 DIAGNOSIS — I10 Essential (primary) hypertension: Secondary | ICD-10-CM

## 2016-04-23 DIAGNOSIS — Z8349 Family history of other endocrine, nutritional and metabolic diseases: Secondary | ICD-10-CM | POA: Diagnosis not present

## 2016-04-23 DIAGNOSIS — E785 Hyperlipidemia, unspecified: Secondary | ICD-10-CM

## 2016-04-23 DIAGNOSIS — Z79899 Other long term (current) drug therapy: Secondary | ICD-10-CM

## 2016-04-23 NOTE — Progress Notes (Signed)
Assessment and Plan:  Hypertension:  -Lisinopril discontinued -Continue medication,  -monitor blood pressure at home.  -Continue DASH diet.   -Reminder to go to the ER if any CP, SOB, nausea, dizziness, severe HA, changes vision/speech, left arm numbness and tingling, and jaw pain.  Cholesterol: -Continue diet and exercise.  -Check cholesterol.   Pre-diabetes: -Continue diet and exercise.  -Check A1C  Vitamin D Def: -check level -continue medications.   Family History of Hemochromotosis -son has recently been diagnosed with hemochromotosis -would like to have genetic testing to determine if he is a carrier  Continue diet and meds as discussed. Further disposition pending results of labs.  HPI 50 y.o. male  presents for 3 month follow up with hypertension, hyperlipidemia, prediabetes and vitamin D.   His blood pressure has been controlled at home, today their BP is BP: 122/80 mmHg.   He does not workout. He denies chest pain, shortness of breath, dizziness.    He is on cholesterol medication and denies myalgias. His cholesterol is at goal. The cholesterol last visit was:   Lab Results  Component Value Date   CHOL 179 01/09/2016   HDL 42 01/09/2016   LDLCALC 96 01/09/2016   TRIG 205* 01/09/2016   CHOLHDL 4.3 01/09/2016     He has been working on diet and exercise for prediabetes, and denies foot ulcerations, hyperglycemia, hypoglycemia , increased appetite, nausea, paresthesia of the feet, polydipsia, polyuria, visual disturbances, vomiting and weight loss. Last A1C in the office was:  Lab Results  Component Value Date   HGBA1C 5.3 01/09/2016    Patient is on Vitamin D supplement.  Lab Results  Component Value Date   VD25OH 25 07/10/2015     Patient's son was recently diagnosed with hemochromotosis and he will be seeing hematology in the next couple weeks.  There is no family history that he is aware of.  His iron and TIBC has been normal historically.   Current  Medications:  Current Outpatient Prescriptions on File Prior to Visit  Medication Sig Dispense Refill  . ALLEGRA-D ALLERGY & CONGESTION 180-240 MG 24 hr tablet TAKE 1 TABLET BY MOUTH DAILY. 90 tablet 1  . aspirin 81 MG tablet Take 81 mg by mouth daily.    . Cholecalciferol (VITAMIN D) 2000 UNITS CAPS Take by mouth. Takes 3 capsules daily to equal 6000 units daily    . CRESTOR 10 MG tablet TAKE 1 TABLET BY MOUTH EVERY DAY 90 tablet 1  . tamsulosin (FLOMAX) 0.4 MG CAPS capsule Take 1 capsule (0.4 mg total) by mouth daily. 90 capsule 1   No current facility-administered medications on file prior to visit.    Medical History:  Past Medical History  Diagnosis Date  . Hyperlipidemia   . Hypertension   . BPH (benign prostatic hyperplasia)   . Allergic rhinitis, cause unspecified     Allergies: No Known Allergies   Review of Systems:  Review of Systems  Constitutional: Negative for fever, chills and malaise/fatigue.  HENT: Negative for congestion, ear pain and sore throat.   Eyes: Negative.   Respiratory: Negative for cough, sputum production, shortness of breath and wheezing.   Cardiovascular: Negative for chest pain, palpitations and leg swelling.  Gastrointestinal: Negative for heartburn, abdominal pain, diarrhea, constipation, blood in stool and melena.  Genitourinary: Negative.   Skin: Negative.   Neurological: Negative for dizziness, sensory change, loss of consciousness and headaches.  Psychiatric/Behavioral: Negative for depression. The patient is not nervous/anxious and does not have insomnia.  Family history- Review and unchanged  Social history- Review and unchanged  Physical Exam: BP 122/80 mmHg  Pulse 90  Temp(Src) 98 F (36.7 C) (Temporal)  Resp 16  Ht 6\' 2"  (1.88 m)  Wt 219 lb (99.338 kg)  BMI 28.11 kg/m2 Wt Readings from Last 3 Encounters:  04/23/16 219 lb (99.338 kg)  01/09/16 216 lb (97.977 kg)  07/10/15 200 lb (90.719 kg)    General Appearance:  Well nourished well developed, in no apparent distress. Eyes: PERRLA, EOMs, conjunctiva no swelling or erythema ENT/Mouth: Ear canals normal without obstruction, swelling, erythma, discharge.  TMs normal bilaterally.  Oropharynx moist, clear, without exudate, or postoropharyngeal swelling. Neck: Supple, thyroid normal,no cervical adenopathy  Respiratory: Respiratory effort normal, Breath sounds clear A&P without rhonchi, wheeze, or rale.  No retractions, no accessory usage. Cardio: RRR with no MRGs. Brisk peripheral pulses without edema.  Abdomen: Soft, + BS,  Non tender, no guarding, rebound, hernias, masses. Musculoskeletal: Full ROM, 5/5 strength, Normal gait Skin: Warm, dry without rashes, lesions, ecchymosis.  Neuro: Awake and oriented X 3, Cranial nerves intact. Normal muscle tone, no cerebellar symptoms. Psych: Normal affect, Insight and Judgment appropriate.    Starlyn Skeans, PA-C 9:00 AM [facility]

## 2016-04-29 LAB — HEMOCHROMATOSIS DNA-PCR(C282Y,H63D)

## 2016-07-09 ENCOUNTER — Encounter: Payer: Self-pay | Admitting: Internal Medicine

## 2016-08-03 ENCOUNTER — Other Ambulatory Visit: Payer: Self-pay | Admitting: Physician Assistant

## 2016-08-13 ENCOUNTER — Encounter: Payer: Self-pay | Admitting: Internal Medicine

## 2016-08-20 ENCOUNTER — Ambulatory Visit (INDEPENDENT_AMBULATORY_CARE_PROVIDER_SITE_OTHER): Payer: BLUE CROSS/BLUE SHIELD | Admitting: Internal Medicine

## 2016-08-20 ENCOUNTER — Encounter: Payer: Self-pay | Admitting: Internal Medicine

## 2016-08-20 VITALS — BP 132/82 | HR 66 | Temp 98.2°F | Resp 16 | Ht 74.0 in | Wt 210.0 lb

## 2016-08-20 DIAGNOSIS — Z0000 Encounter for general adult medical examination without abnormal findings: Secondary | ICD-10-CM

## 2016-08-20 DIAGNOSIS — E349 Endocrine disorder, unspecified: Secondary | ICD-10-CM

## 2016-08-20 DIAGNOSIS — E559 Vitamin D deficiency, unspecified: Secondary | ICD-10-CM | POA: Diagnosis not present

## 2016-08-20 DIAGNOSIS — Z0001 Encounter for general adult medical examination with abnormal findings: Secondary | ICD-10-CM

## 2016-08-20 DIAGNOSIS — Z1329 Encounter for screening for other suspected endocrine disorder: Secondary | ICD-10-CM

## 2016-08-20 DIAGNOSIS — Z1389 Encounter for screening for other disorder: Secondary | ICD-10-CM

## 2016-08-20 DIAGNOSIS — Z23 Encounter for immunization: Secondary | ICD-10-CM

## 2016-08-20 DIAGNOSIS — E782 Mixed hyperlipidemia: Secondary | ICD-10-CM

## 2016-08-20 DIAGNOSIS — Z136 Encounter for screening for cardiovascular disorders: Secondary | ICD-10-CM | POA: Diagnosis not present

## 2016-08-20 DIAGNOSIS — I10 Essential (primary) hypertension: Secondary | ICD-10-CM

## 2016-08-20 DIAGNOSIS — Z79899 Other long term (current) drug therapy: Secondary | ICD-10-CM

## 2016-08-20 DIAGNOSIS — Z130 Encounter for screening for diseases of the blood and blood-forming organs and certain disorders involving the immune mechanism: Secondary | ICD-10-CM

## 2016-08-20 DIAGNOSIS — Z131 Encounter for screening for diabetes mellitus: Secondary | ICD-10-CM

## 2016-08-20 DIAGNOSIS — Z125 Encounter for screening for malignant neoplasm of prostate: Secondary | ICD-10-CM

## 2016-08-20 LAB — BASIC METABOLIC PANEL WITH GFR
BUN: 26 mg/dL — AB (ref 7–25)
CHLORIDE: 102 mmol/L (ref 98–110)
CO2: 23 mmol/L (ref 20–31)
Calcium: 9.6 mg/dL (ref 8.6–10.3)
Creat: 0.99 mg/dL (ref 0.70–1.33)
GFR, Est African American: >89 mL/min (ref >=60)
GFR, Est Non African American: 87 mL/min (ref >=60)
GLUCOSE: 85 mg/dL (ref 65–99)
POTASSIUM: 4.4 mmol/L (ref 3.5–5.3)
Sodium: 140 mmol/L (ref 135–146)

## 2016-08-20 LAB — CBC WITH DIFFERENTIAL/PLATELET
BASOS PCT: 1 %
Basophils Absolute: 43 cells/uL (ref 0–200)
EOS ABS: 43 {cells}/uL (ref 15–500)
Eosinophils Relative: 1 %
HEMATOCRIT: 51.7 % — AB (ref 38.5–50.0)
HEMOGLOBIN: 17.8 g/dL — AB (ref 13.2–17.1)
LYMPHS ABS: 1591 {cells}/uL (ref 850–3900)
LYMPHS PCT: 37 %
MCH: 30.3 pg (ref 27.0–33.0)
MCHC: 34.4 g/dL (ref 32.0–36.0)
MCV: 87.9 fL (ref 80.0–100.0)
MONO ABS: 344 {cells}/uL (ref 200–950)
MPV: 9.9 fL (ref 7.5–12.5)
Monocytes Relative: 8 %
Neutro Abs: 2279 cells/uL (ref 1500–7800)
Neutrophils Relative %: 53 %
Platelets: 206 10*3/uL (ref 140–400)
RBC: 5.88 MIL/uL — AB (ref 4.20–5.80)
RDW: 12.8 % (ref 11.0–15.0)
WBC: 4.3 10*3/uL (ref 3.8–10.8)

## 2016-08-20 LAB — LIPID PANEL
CHOLESTEROL: 127 mg/dL (ref 125–200)
HDL: 41 mg/dL (ref >=40)
LDL CALC: 70 mg/dL (ref <130)
TRIGLYCERIDES: 82 mg/dL (ref <150)
Total CHOL/HDL Ratio: 3.1 Ratio (ref <=5.0)
VLDL: 16 mg/dL (ref <30)

## 2016-08-20 LAB — HEPATIC FUNCTION PANEL
ALBUMIN: 4.8 g/dL (ref 3.6–5.1)
ALK PHOS: 73 U/L (ref 40–115)
ALT: 32 U/L (ref 9–46)
AST: 31 U/L (ref 10–35)
BILIRUBIN INDIRECT: 0.8 mg/dL (ref 0.2–1.2)
Bilirubin, Direct: 0.2 mg/dL (ref <=0.2)
TOTAL PROTEIN: 7.7 g/dL (ref 6.1–8.1)
Total Bilirubin: 1 mg/dL (ref 0.2–1.2)

## 2016-08-20 LAB — URINALYSIS, ROUTINE W REFLEX MICROSCOPIC
BILIRUBIN URINE: NEGATIVE
Glucose, UA: NEGATIVE
Hgb urine dipstick: NEGATIVE
Ketones, ur: NEGATIVE
Leukocytes, UA: NEGATIVE
Nitrite: NEGATIVE
PH: 7 (ref 5.0–8.0)
Protein, ur: NEGATIVE
SPECIFIC GRAVITY, URINE: 1.007 (ref 1.001–1.035)

## 2016-08-20 LAB — HEMOGLOBIN A1C
Hgb A1c MFr Bld: 4.9 % (ref <5.7)
Mean Plasma Glucose: 94 mg/dL

## 2016-08-20 LAB — PSA: PSA: 1.1 ng/mL (ref <=4.0)

## 2016-08-20 LAB — MAGNESIUM: MAGNESIUM: 2.5 mg/dL (ref 1.5–2.5)

## 2016-08-20 LAB — IRON AND TIBC
%SAT: 21 % (ref 15–60)
IRON: 87 ug/dL (ref 50–180)
TIBC: 408 ug/dL (ref 250–425)
UIBC: 321 ug/dL (ref 125–400)

## 2016-08-20 LAB — TSH: TSH: 2.33 m[IU]/L (ref 0.40–4.50)

## 2016-08-20 LAB — VITAMIN B12: Vitamin B-12: 384 pg/mL (ref 200–1100)

## 2016-08-20 MED ORDER — ROSUVASTATIN CALCIUM 10 MG PO TABS: 10.0000 mg | ORAL_TABLET | Freq: Every day | ORAL | 1 refills | Status: DC

## 2016-08-20 MED ORDER — TAMSULOSIN HCL 0.4 MG PO CAPS: 0.4000 mg | ORAL_CAPSULE | Freq: Every day | ORAL | 1 refills | Status: DC

## 2016-08-20 NOTE — Progress Notes (Signed)
Complete Physical  Assessment and Plan:   1. Need for prophylactic vaccination and inoculation against influenza  - Flu Vaccine QUAD with presevative  2. Encounter for general adult medical examination with abnormal findings  - CBC with Differential/Platelet - BASIC METABOLIC PANEL WITH GFR - Hepatic function panel - Magnesium  3. Mixed hyperlipidemia  - Lipid panel  4. Screening for diabetes mellitus  - Hemoglobin A1c - Insulin, random  5. Screening for prostate cancer  - PSA  6. Screening for deficiency anemia  - Iron and TIBC - Vitamin B12  7. Testosterone deficiency  - Testosterone  8. Screening for hematuria or proteinuria  - Urinalysis, Routine w reflex microscopic (not at [facility]) - Microalbumin / creatinine urine ratio  9. Screening for cardiovascular condition - EKG 12-Lead  10. Vitamin D deficiency - VITAMIN D 25 Hydroxy (Vit-D Deficiency, Fractures)  11. Screening for thyroid disorder - TSH   Discussed med's effects and SE's. Screening labs and tests as requested with regular follow-up as recommended.  HPI Patient presents for a complete physical.   His blood pressure has been controlled at home, today their BP is BP: 132/82 He does workout. He denies chest pain, shortness of breath, dizziness.  He reports that he is still working.  He reports that his plantar fasciitis is still bothersome sometimes.  He does not have issues as long as he is not road running.     He is on cholesterol medication and denies myalgias. His cholesterol is at goal. The cholesterol last visit was:   Lab Results  Component Value Date   CHOL 179 01/09/2016   HDL 42 01/09/2016   LDLCALC 96 01/09/2016   TRIG 205 (H) 01/09/2016   CHOLHDL 4.3 01/09/2016    He has been working on diet and exercise for prediabetes, he is on bASA, he is not on ACE/ARB and denies foot ulcerations, hyperglycemia, hypoglycemia , increased appetite, nausea, paresthesia of the feet,  polydipsia, polyuria, visual disturbances, vomiting and weight loss. Last A1C in the office was:  Lab Results  Component Value Date   HGBA1C 5.3 01/09/2016    Patient is on Vitamin D supplement.   Lab Results  Component Value Date   VD25OH 90 07/10/2015      Last PSA was: Lab Results  Component Value Date   PSA 0.91 07/10/2015  .  Denies BPH symptoms daytime frequency, double voiding, dysuria, hematuria, hesitancy, incontinence, intermittency, nocturia, sensation of incomplete bladder emptying, suprapubic pain, urgency or weak urinary stream.  He reports that he is still going to the dermatologist every six months for skin checks monthly.  He reports that he has been having normal checks.    Current Medications:  Current Outpatient Prescriptions on File Prior to Visit  Medication Sig Dispense Refill  . ALLEGRA-D ALLERGY & CONGESTION 180-240 MG 24 hr tablet TAKE 1 TABLET BY MOUTH DAILY. 90 tablet 1  . aspirin 81 MG tablet Take 81 mg by mouth daily.    . Cholecalciferol (VITAMIN D) 2000 UNITS CAPS Take by mouth. Takes 3 capsules daily to equal 6000 units daily    . CRESTOR 10 MG tablet TAKE 1 TABLET BY MOUTH EVERY DAY 90 tablet 1  . tamsulosin (FLOMAX) 0.4 MG CAPS capsule Take 1 capsule (0.4 mg total) by mouth daily. 90 capsule 1   No current facility-administered medications on file prior to visit.     Health Maintenance:  Immunization History  Administered Date(s) Administered  . Influenza Split 08/16/2014  .  Influenza Whole 09/08/2012  . PPD Test 04/15/2014  . Pneumococcal-Unspecified 07/30/2009  . Td 04/25/2007    Tetanus: 2008 Pneumovax: 2010 Flu vaccine: 2017 Colonoscopy: Due in 2018 Eye: Has not had them checked recently.    Patient Care Team: Unk Pinto, MD as PCP - General (Internal Medicine)  Allergies: No Known Allergies  Medical History:  Past Medical History:  Diagnosis Date  . Allergic rhinitis, cause unspecified   . BPH (benign prostatic  hyperplasia)   . Hyperlipidemia   . Hypertension     Surgical History:  Past Surgical History:  Procedure Laterality Date  . COLONOSCOPY  2008   Dr. Fuller Plan; family hx colon cancer in mother  . VASECTOMY      Family History:  Family History  Problem Relation Age of Onset  . Colon cancer Mother 22  . Cancer Mother   . Diabetes Mother   . Kidney disease Mother   . COPD Mother   . Stomach cancer Neg Hx   . Cancer Father     Prostate, Lymphoma  . Hyperlipidemia Father   . Stroke Father   . Hyperlipidemia Brother     Social History:   Social History  Substance Use Topics  . Smoking status: Never Smoker  . Smokeless tobacco: Never Used  . Alcohol use Yes     Comment: rarely    Review of Systems:  Review of Systems  Constitutional: Negative for chills, fever and malaise/fatigue.  HENT: Negative for congestion, ear pain and sore throat.   Eyes: Negative.   Respiratory: Negative for cough, shortness of breath and wheezing.   Cardiovascular: Negative for chest pain, palpitations and leg swelling.  Gastrointestinal: Negative for abdominal pain, blood in stool, constipation, diarrhea, heartburn and melena.  Genitourinary: Negative.   Skin: Negative.   Neurological: Negative for dizziness, sensory change, loss of consciousness and headaches.  Psychiatric/Behavioral: Negative for depression. The patient is not nervous/anxious and does not have insomnia.     Physical Exam: Estimated body mass index is 26.96 kg/m as calculated from the following:   Height as of this encounter: 6\' 2"  (1.88 m).   Weight as of this encounter: 210 lb (95.3 kg). BP 132/82   Pulse 66   Temp 98.2 F (36.8 C) (Temporal)   Resp 16   Ht 6\' 2"  (1.88 m)   Wt 210 lb (95.3 kg)   BMI 26.96 kg/m   General Appearance: Well nourished, in no apparent distress.  Eyes: PERRLA, EOMs, conjunctiva no swelling or erythema ENT/Mouth: Ear canals clear bilaterally with no erythema, swelling, discharge.  TMs  normal bilaterally with no erythema, bulging, or retractions.  Oropharynx clear and moist with no exudate, swelling, or erythema.  Dentition normal.   Neck: Supple, thyroid normal. No bruits, JVD, cervical adenopathy Respiratory: Respiratory effort normal, BS equal bilaterally without rales, rhonchi, wheezing or stridor.  Cardio: RRR without murmurs, rubs or gallops. Brisk peripheral pulses without edema.  Chest: symmetric, with normal excursions Abdomen: Soft, nontender, no guarding, rebound, hernias, masses, or organomegaly.  Musculoskeletal: Full ROM all peripheral extremities,5/5 strength, and normal gait.  Skin: Warm, dry without rashes, lesions, ecchymosis. Neuro: A&Ox3, Cranial nerves intact, reflexes equal bilaterally. Normal muscle tone, no cerebellar symptoms. Sensation intact.  Psych: Normal affect, Insight and Judgment appropriate.   EKG: WNL no changes.  Over 40 minutes of exam, counseling, chart review and critical decision making was performed  Starlyn Skeans 9:49 AM [facility]

## 2016-08-21 LAB — MICROALBUMIN / CREATININE URINE RATIO
CREATININE, URINE: 23 mg/dL (ref 20–370)
MICROALB UR: 0.2 mg/dL
Microalb Creat Ratio: 9 mcg/mg creat (ref <30)

## 2016-08-21 LAB — TESTOSTERONE: Testosterone: 266 ng/dL (ref 250–827)

## 2016-08-21 LAB — INSULIN, RANDOM: INSULIN: 5.4 u[IU]/mL (ref 2.0–19.6)

## 2016-08-21 LAB — VITAMIN D 25 HYDROXY (VIT D DEFICIENCY, FRACTURES): VIT D 25 HYDROXY: 70 ng/mL (ref 30–100)

## 2016-11-09 ENCOUNTER — Other Ambulatory Visit: Payer: Self-pay | Admitting: *Deleted

## 2016-11-09 MED ORDER — FLUTICASONE PROPIONATE 50 MCG/ACT NA SUSP
1.0000 | Freq: Every day | NASAL | 0 refills | Status: DC
Start: 2016-11-09 — End: 2023-04-22

## 2017-01-28 ENCOUNTER — Other Ambulatory Visit: Payer: Self-pay | Admitting: Physician Assistant

## 2017-02-18 ENCOUNTER — Ambulatory Visit: Payer: Self-pay | Admitting: Internal Medicine

## 2017-02-20 ENCOUNTER — Other Ambulatory Visit: Payer: Self-pay | Admitting: Internal Medicine

## 2017-03-04 ENCOUNTER — Other Ambulatory Visit: Payer: Self-pay | Admitting: *Deleted

## 2017-03-04 MED ORDER — FEXOFENADINE-PSEUDOEPHED ER 180-240 MG PO TB24: 1.0000 | ORAL_TABLET | Freq: Every day | ORAL | 0 refills | Status: AC

## 2017-08-22 ENCOUNTER — Other Ambulatory Visit: Payer: Self-pay | Admitting: Internal Medicine

## 2017-08-23 ENCOUNTER — Encounter: Payer: Self-pay | Admitting: Gastroenterology

## 2017-08-26 ENCOUNTER — Encounter: Payer: Self-pay | Admitting: Internal Medicine

## 2017-08-31 ENCOUNTER — Encounter: Payer: Self-pay | Admitting: Gastroenterology

## 2017-09-08 ENCOUNTER — Encounter: Payer: Self-pay | Admitting: Gastroenterology

## 2017-11-03 ENCOUNTER — Other Ambulatory Visit: Payer: Self-pay

## 2017-11-03 ENCOUNTER — Ambulatory Visit (AMBULATORY_SURGERY_CENTER): Payer: Self-pay | Admitting: *Deleted

## 2017-11-03 VITALS — Ht 73.0 in | Wt 227.0 lb

## 2017-11-03 DIAGNOSIS — Z86010 Personal history of colonic polyps: Secondary | ICD-10-CM

## 2017-11-03 DIAGNOSIS — Z800 Family history of malignant neoplasm of digestive organs: Secondary | ICD-10-CM

## 2017-11-03 MED ORDER — NA SULFATE-K SULFATE-MG SULF 17.5-3.13-1.6 GM/177ML PO SOLN: ORAL | 0 refills | Status: DC

## 2017-11-03 NOTE — Progress Notes (Signed)
Patient denies any allergies to eggs or soy. Patient denies any problems with anesthesia/sedation. Patient denies any oxygen use at home. Patient denies taking any diet/weight loss medications or blood thinners. EMMI education assisgned to patient on colonoscopy, this was explained and instructions given to patient. suprep coupon printed and given to pt during pv.

## 2017-11-07 ENCOUNTER — Encounter: Payer: Self-pay | Admitting: Gastroenterology

## 2017-11-10 ENCOUNTER — Other Ambulatory Visit: Payer: Self-pay

## 2017-11-10 ENCOUNTER — Ambulatory Visit (AMBULATORY_SURGERY_CENTER): Payer: BLUE CROSS/BLUE SHIELD | Admitting: Gastroenterology

## 2017-11-10 ENCOUNTER — Encounter: Payer: Self-pay | Admitting: Gastroenterology

## 2017-11-10 VITALS — BP 127/80 | HR 74 | Temp 96.0°F | Resp 15 | Ht 73.0 in | Wt 227.0 lb

## 2017-11-10 DIAGNOSIS — Z86010 Personal history of colonic polyps: Secondary | ICD-10-CM

## 2017-11-10 DIAGNOSIS — D123 Benign neoplasm of transverse colon: Secondary | ICD-10-CM | POA: Diagnosis not present

## 2017-11-10 DIAGNOSIS — K635 Polyp of colon: Secondary | ICD-10-CM

## 2017-11-10 DIAGNOSIS — D122 Benign neoplasm of ascending colon: Secondary | ICD-10-CM

## 2017-11-10 DIAGNOSIS — Z800 Family history of malignant neoplasm of digestive organs: Secondary | ICD-10-CM | POA: Diagnosis not present

## 2017-11-10 MED ORDER — SODIUM CHLORIDE 0.9 % IV SOLN: 500.0000 mL | Freq: Once | INTRAVENOUS | Status: AC

## 2017-11-10 NOTE — Op Note (Addendum)
Kenneth Hoffman Patient Name: Kenneth Hoffman Procedure Date: 11/10/2017 9:58 AM MRN: [id number] Endoscopist: Ladene Artist , MD Age: 52 Referring MD:  Date of Birth: 11-29-1965 Gender: Male Account #: [id number] Procedure:                Colonoscopy Indications:              Surveillance: Personal history of adenomatous                            polyps on last colonoscopy 5 years ago. Family                            history of colon cancer in a 1st degree relative. Medicines:                Monitored Anesthesia Care Procedure:                Pre-Anesthesia Assessment:                           - Prior to the procedure, a History and Physical                            was performed, and patient medications and                            allergies were reviewed. The patient's tolerance of                            previous anesthesia was also reviewed. The risks                            and benefits of the procedure and the sedation                            options and risks were discussed with the patient.                            All questions were answered, and informed consent                            was obtained. Prior Anticoagulants: The patient has                            taken no previous anticoagulant or antiplatelet                            agents. ASA Grade Assessment: II - A patient with                            mild systemic disease. After reviewing the risks                            and benefits, the patient was deemed in  satisfactory condition to undergo the procedure.                           After obtaining informed consent, the colonoscope                            was passed under direct vision. Throughout the                            procedure, the patient's blood pressure, pulse, and                            oxygen saturations were monitored continuously. The                            Colonoscope was  introduced through the anus and                            advanced to the the cecum, identified by                            appendiceal orifice and ileocecal valve. The                            ileocecal valve, appendiceal orifice, and rectum                            were photographed. The quality of the bowel                            preparation was adequate after extensive lavage and                            suctioning. The colonoscopy was performed without                            difficulty. The patient tolerated the procedure                            well. Scope In: 10:19:56 AM Scope Out: 10:38:36 AM Scope Withdrawal Time: 0 hours 15 minutes 14 seconds  Total Procedure Duration: 0 hours 18 minutes 40 seconds  Findings:                 The perianal and digital rectal examinations were                            normal.                           A 7 mm polyp was found in the ascending colon. The                            polyp was sessile. The polyp was removed with a  cold snare. Resection and retrieval were complete.                           Two sessile polyps were found in the transverse                            colon. The polyps were 4 to 5 mm in size. These                            polyps were removed with a cold biopsy forceps.                            Resection and retrieval were complete.                           A few medium-mouthed diverticula were found in the                            sigmoid colon.                           The exam was otherwise without abnormality on                            direct and retroflexion views. Complications:            No immediate complications. Estimated blood loss:                            None. Estimated Blood Loss:     Estimated blood loss: none. Impression:               - One 7 mm polyp in the ascending colon, removed                            with a cold snare. Resected and  retrieved.                           - Two 4 to 5 mm polyps in the transverse colon,                            removed with a cold biopsy forceps. Resected and                            retrieved.                           - Diverticulosis in the sigmoid colon.                           - The examination was otherwise normal on direct                            and retroflexion views. Recommendation:           - Repeat colonoscopy  in 5 years for surveillance                            with a more extensive bowel prep.                           - Patient has a contact number available for                            emergencies. The signs and symptoms of potential                            delayed complications were discussed with the                            patient. Return to normal activities tomorrow.                            Written discharge instructions were provided to the                            patient.                           - High fiber diet.                           - Continue present medications.                           - Await pathology results. Ladene Artist, MD 11/10/2017 10:42:32 AM This report has been signed electronically.

## 2017-11-10 NOTE — Patient Instructions (Signed)
**  Handouts given on polyps, diverticulosis, and high fiber diet**  3 polyps today   YOU HAD AN ENDOSCOPIC PROCEDURE TODAY AT [facility]:   Refer to the procedure report that was given to you for any specific questions about what was found during the examination.  If the procedure report does not answer your questions, please call your gastroenterologist to clarify.  If you requested that your care partner not be given the details of your procedure findings, then the procedure report has been included in a sealed envelope for you to review at your convenience later.  YOU SHOULD EXPECT: Some feelings of bloating in the abdomen. Passage of more gas than usual.  Walking can help get rid of the air that was put into your GI tract during the procedure and reduce the bloating. If you had a lower endoscopy (such as a colonoscopy or flexible sigmoidoscopy) you may notice spotting of blood in your stool or on the toilet paper. If you underwent a bowel prep for your procedure, you may not have a normal bowel movement for a few days.  Please Note:  You might notice some irritation and congestion in your nose or some drainage.  This is from the oxygen used during your procedure.  There is no need for concern and it should clear up in a day or so.  SYMPTOMS TO REPORT IMMEDIATELY:   Following lower endoscopy (colonoscopy or flexible sigmoidoscopy):  Excessive amounts of blood in the stool  Significant tenderness or worsening of abdominal pains  Swelling of the abdomen that is new, acute  Fever of 100F or higher   For urgent or emergent issues, a gastroenterologist can be reached at any hour by calling [phone].   DIET:  We do recommend a small meal at first, but then you may proceed to your regular diet.  Drink plenty of fluids but you should avoid alcoholic beverages for 24 hours.  ACTIVITY:  You should plan to take it easy for the rest of today and you should NOT DRIVE or use  heavy machinery until tomorrow (because of the sedation medicines used during the test).    FOLLOW UP: Our staff will call the number listed on your records the next business day following your procedure to check on you and address any questions or concerns that you may have regarding the information given to you following your procedure. If we do not reach you, we will leave a message.  However, if you are feeling well and you are not experiencing any problems, there is no need to return our call.  We will assume that you have returned to your regular daily activities without incident.  If any biopsies were taken you will be contacted by phone or by letter within the next 1-3 weeks.  Please call us at [phone] if you have not heard about the biopsies in 3 weeks.    SIGNATURES/CONFIDENTIALITY: You and/or your care partner have signed paperwork which will be entered into your electronic medical record.  These signatures attest to the fact that that the information above on your After Visit Summary has been reviewed and is understood.  Full responsibility of the confidentiality of this discharge information lies with you and/or your care-partner.

## 2017-11-10 NOTE — Progress Notes (Signed)
Pt's states no medical or surgical changes since previsit or office visit. 

## 2017-11-10 NOTE — Progress Notes (Signed)
Report to PACU, RN, vss, BBS= Clear.  

## 2017-11-10 NOTE — Progress Notes (Signed)
Called to room to assist during endoscopic procedure.  Patient ID and intended procedure confirmed with present staff. Received instructions for my participation in the procedure from the performing physician.  

## 2017-11-11 ENCOUNTER — Telehealth: Payer: Self-pay

## 2017-11-11 NOTE — Telephone Encounter (Signed)
  Follow up Call-  Call back number 11/10/2017  Post procedure Call Back phone  # [phone]  Permission to leave phone message Yes  Some recent data might be hidden     Patient questions:  Do you have a fever, pain , or abdominal swelling? No. Pain Score  0 *  Have you tolerated food without any problems? Yes.    Have you been able to return to your normal activities? Yes.    Do you have any questions about your discharge instructions: Diet   No. Medications  No. Follow up visit  No.  Do you have questions or concerns about your Care? No.  Actions: * If pain score is 4 or above: No action needed, pain <4.  No problems noted per pt. maw

## 2017-11-24 ENCOUNTER — Encounter: Payer: Self-pay | Admitting: Gastroenterology

## 2018-02-14 ENCOUNTER — Other Ambulatory Visit: Payer: Self-pay | Admitting: Physician Assistant

## 2018-05-15 DIAGNOSIS — B001 Herpesviral vesicular dermatitis: Secondary | ICD-10-CM | POA: Insufficient documentation

## 2019-01-24 ENCOUNTER — Other Ambulatory Visit: Payer: Self-pay | Admitting: Physician Assistant

## 2020-01-18 ENCOUNTER — Other Ambulatory Visit: Payer: Self-pay | Admitting: Physician Assistant

## 2020-01-22 ENCOUNTER — Encounter: Payer: Self-pay | Admitting: *Deleted

## 2020-01-23 ENCOUNTER — Telehealth: Payer: Self-pay

## 2020-01-23 NOTE — Telephone Encounter (Signed)
Left message to call office wider shave needed on right chest.

## 2020-01-23 NOTE — Telephone Encounter (Signed)
-----   Message from Warren Danes, [place] sent at 01/22/2020  4:03 PM EDT ----- Please schedule a wider shave excision

## 2020-02-07 ENCOUNTER — Encounter: Payer: BLUE CROSS/BLUE SHIELD | Admitting: Physician Assistant

## 2020-02-07 ENCOUNTER — Other Ambulatory Visit: Payer: Self-pay

## 2020-02-07 ENCOUNTER — Ambulatory Visit (INDEPENDENT_AMBULATORY_CARE_PROVIDER_SITE_OTHER): Payer: BC Managed Care – PPO | Admitting: Physician Assistant

## 2020-02-07 ENCOUNTER — Encounter: Payer: Self-pay | Admitting: Physician Assistant

## 2020-02-07 DIAGNOSIS — D235 Other benign neoplasm of skin of trunk: Secondary | ICD-10-CM

## 2020-02-07 DIAGNOSIS — D239 Other benign neoplasm of skin, unspecified: Secondary | ICD-10-CM

## 2020-02-07 NOTE — Progress Notes (Signed)
   Follow-Up Visit   Subjective  Kenneth Hoffman is a 54 y.o. male who presents for the following: Procedure (Here for widershave right chest severe atypia). Healed well without complication   The following portions of the chart were reviewed this encounter and updated as appropriate: Tobacco  Allergies  Meds  Problems  Med Hx  Surg Hx  Fam Hx      Objective  Well appearing patient in no apparent distress; mood and affect are within normal limits.  A focused examination was performed including chest. Relevant physical exam findings are noted in the Assessment and Plan.  Objective  Right Chest: Pink macule   Assessment & Plan  Dysplastic nevus Right Chest  Epidermal / dermal shaving - Right Chest  Lesion length (cm):  0.9 Lesion width (cm):  0.1 Margin per side (cm):  0.1 Total excision diameter (cm):  1.1 Informed consent: discussed and consent obtained   Timeout: patient name, date of birth, surgical site, and procedure verified   Procedure prep:  Patient was prepped and draped in usual sterile fashion Prep type:  Chlorhexidine Anesthesia: the lesion was anesthetized in a standard fashion   Anesthetic:  1% lidocaine w/ epinephrine 1-100,000 local infiltration Instrument used: DermaBlade   Hemostasis achieved with: aluminum chloride   Outcome: patient tolerated procedure well   Post-procedure details: sterile dressing applied and wound care instructions given   Dressing type: petrolatum gauze, petrolatum and bandage    Specimen 1 - Surgical pathology Differential Diagnosis: severe DN Check Margins: yes Pink macule

## 2020-02-12 ENCOUNTER — Telehealth: Payer: Self-pay

## 2020-02-12 NOTE — Telephone Encounter (Signed)
-----   Message from Warren Danes, [place] sent at 02/12/2020 12:18 PM EDT ----- Clear margins. 6 mo fu

## 2020-02-12 NOTE — Telephone Encounter (Signed)
Message left for patient margins are free pathology available on mychart,  any questions call the office recheck 6 months.

## 2020-04-19 NOTE — Addendum Note (Signed)
Addended by: Robyne Askew R on: 04/19/2020 03:24 PM   Modules accepted: Level of Service

## 2021-03-11 ENCOUNTER — Encounter: Payer: Self-pay | Admitting: Physician Assistant

## 2021-03-11 ENCOUNTER — Other Ambulatory Visit: Payer: Self-pay

## 2021-03-11 ENCOUNTER — Ambulatory Visit: Payer: BC Managed Care – PPO | Admitting: Physician Assistant

## 2021-03-11 DIAGNOSIS — L570 Actinic keratosis: Secondary | ICD-10-CM | POA: Diagnosis not present

## 2021-03-11 DIAGNOSIS — Z86018 Personal history of other benign neoplasm: Secondary | ICD-10-CM

## 2021-03-11 DIAGNOSIS — L821 Other seborrheic keratosis: Secondary | ICD-10-CM

## 2021-03-11 DIAGNOSIS — Z1283 Encounter for screening for malignant neoplasm of skin: Secondary | ICD-10-CM

## 2021-03-11 DIAGNOSIS — D229 Melanocytic nevi, unspecified: Secondary | ICD-10-CM

## 2021-03-11 DIAGNOSIS — L814 Other melanin hyperpigmentation: Secondary | ICD-10-CM | POA: Diagnosis not present

## 2021-03-11 DIAGNOSIS — Z85820 Personal history of malignant melanoma of skin: Secondary | ICD-10-CM | POA: Diagnosis not present

## 2021-03-11 DIAGNOSIS — D485 Neoplasm of uncertain behavior of skin: Secondary | ICD-10-CM | POA: Diagnosis not present

## 2021-03-11 DIAGNOSIS — L578 Other skin changes due to chronic exposure to nonionizing radiation: Secondary | ICD-10-CM

## 2021-03-11 NOTE — Patient Instructions (Signed)

## 2021-03-12 ENCOUNTER — Encounter: Payer: Self-pay | Admitting: Physician Assistant

## 2021-03-12 NOTE — Progress Notes (Signed)
   Follow-Up Visit   Subjective  Kenneth Hoffman is a 55 y.o. male who presents for the following: Annual Exam (New lesions on left thigh- "changed", few spots on back patient wife wants checked. Personal history of melanoma and several atypical nevi.).   The following portions of the chart were reviewed this encounter and updated as appropriate:  Tobacco  Allergies  Meds  Problems  Med Hx  Surg Hx  Fam Hx      Objective  Well appearing patient in no apparent distress; mood and affect are within normal limits.  A full examination was performed including scalp, head, eyes, ears, nose, lips, neck, chest, axillae, abdomen, back, buttocks, bilateral upper extremities, bilateral lower extremities, hands, feet, fingers, toes, fingernails, and toenails. All findings within normal limits unless otherwise noted below.  Objective  Left Lower Thigh - Anterior: Bichromic dark nested macule.        Objective  Left Anterior Lobule, Mid Parietal Scalp (2), Right Anterior Lobule: Erythematous patches with gritty scale.   Assessment & Plan  Neoplasm of uncertain behavior of skin Left Lower Thigh - Anterior  Skin / nail biopsy Type of biopsy: tangential   Informed consent: discussed and consent obtained   Timeout: patient name, date of birth, surgical site, and procedure verified   Procedure prep:  Patient was prepped and draped in usual sterile fashion (Non sterile) Prep type:  Chlorhexidine Anesthesia: the lesion was anesthetized in a standard fashion   Anesthetic:  1% lidocaine w/ epinephrine 1-100,000 local infiltration Instrument used: flexible razor blade   Hemostasis achieved with: aluminum chloride   Outcome: patient tolerated procedure well   Post-procedure details: sterile dressing applied and wound care instructions given   Dressing type: bandage and petrolatum    Specimen 1 - Surgical pathology Differential Diagnosis: R/O Atypia  Check Margins: No  AK (actinic  keratosis) (4) Left Anterior Lobule; Right Anterior Lobule; Mid Parietal Scalp (2)  Destruction of lesion - Left Anterior Lobule, Mid Parietal Scalp, Right Anterior Lobule Complexity: simple   Destruction method: cryotherapy   Informed consent: discussed and consent obtained   Timeout:  patient name, date of birth, surgical site, and procedure verified Lesion destroyed using liquid nitrogen: Yes   Cryotherapy cycles:  1 Outcome: patient tolerated procedure well with no complications   Post-procedure details: wound care instructions given    Lentigines - Scattered tan macules - Discussed due to sun exposure - Benign, observe - Call for any changes  Seborrheic Keratoses - Stuck-on, waxy, tan-brown papules and plaques  - Discussed benign etiology and prognosis. - Observe - Call for any changes  Melanocytic Nevi - Tan-brown and/or pink-flesh-colored symmetric macules and papules - Benign appearing on exam today - Observation - Call clinic for new or changing moles - Recommend daily use of broad spectrum spf 30+ sunscreen to sun-exposed areas.   Actinic Damage - diffuse scaly erythematous macules with underlying dyspigmentation - Recommend daily broad spectrum sunscreen SPF 30+ to sun-exposed areas, reapply every 2 hours as needed.  - Call for new or changing lesions.  Skin cancer screening performed today.  I, Jeslie Lowe, PA-C, have reviewed all documentation's for this visit.  The documentation on 03/12/21 for the exam, diagnosis, procedures and orders are all accurate and complete.

## 2021-03-17 ENCOUNTER — Ambulatory Visit: Payer: BC Managed Care – PPO | Admitting: Physician Assistant

## 2021-05-04 ENCOUNTER — Ambulatory Visit: Payer: BC Managed Care – PPO | Admitting: Physician Assistant

## 2021-05-07 ENCOUNTER — Ambulatory Visit: Payer: BC Managed Care – PPO | Admitting: Physician Assistant

## 2021-07-16 ENCOUNTER — Telehealth: Payer: Self-pay | Admitting: Physician Assistant

## 2021-07-16 MED ORDER — TOLAK 4 % EX CREA: TOPICAL_CREAM | CUTANEOUS | 1 refills | Status: AC

## 2021-07-16 NOTE — Telephone Encounter (Signed)
Tolak for face and scalp nightly for 2 weeks follow up 4-6 weeks after treatment has healed.

## 2021-07-16 NOTE — Telephone Encounter (Signed)
Per nurse please send TE to remind them to get [facility] to send Rx for Tolak for this patient.

## 2022-01-05 DIAGNOSIS — H6993 Unspecified Eustachian tube disorder, bilateral: Secondary | ICD-10-CM | POA: Insufficient documentation

## 2022-03-08 DIAGNOSIS — G4733 Obstructive sleep apnea (adult) (pediatric): Secondary | ICD-10-CM | POA: Insufficient documentation

## 2022-04-20 ENCOUNTER — Encounter: Payer: Self-pay | Admitting: Physician Assistant

## 2022-04-20 ENCOUNTER — Ambulatory Visit (INDEPENDENT_AMBULATORY_CARE_PROVIDER_SITE_OTHER): Payer: BC Managed Care – PPO | Admitting: Physician Assistant

## 2022-04-20 DIAGNOSIS — D485 Neoplasm of uncertain behavior of skin: Secondary | ICD-10-CM | POA: Diagnosis not present

## 2022-04-20 DIAGNOSIS — Z1283 Encounter for screening for malignant neoplasm of skin: Secondary | ICD-10-CM

## 2022-04-20 DIAGNOSIS — L570 Actinic keratosis: Secondary | ICD-10-CM

## 2022-04-20 DIAGNOSIS — Z85820 Personal history of malignant melanoma of skin: Secondary | ICD-10-CM

## 2022-04-20 NOTE — Patient Instructions (Signed)

## 2022-04-26 ENCOUNTER — Encounter: Payer: Self-pay | Admitting: Physician Assistant

## 2022-04-26 ENCOUNTER — Telehealth: Payer: Self-pay | Admitting: *Deleted

## 2022-04-26 NOTE — Progress Notes (Signed)
   Follow-Up Visit   Subjective  Kenneth Hoffman is a 56 y.o. male who presents for the following: Annual Exam (Patient here today for skin check, per patient his wife thinks that his melanoma site on his scalp is changing color. Per patient his wife also notice a lesion on his right scalp that resembles his previous melanoma site. Patient states that his wife also noticed scale on his left temple. No family history of atypical moles, melanoma or non mole skin cancer. ).   The following portions of the chart were reviewed this encounter and updated as appropriate:  Tobacco  Allergies  Meds  Problems  Med Hx  Surg Hx  Fam Hx      Objective  Well appearing patient in no apparent distress; mood and affect are within normal limits.  A full examination was performed including scalp, head, eyes, ears, nose, lips, neck, chest, axillae, abdomen, back, buttocks, bilateral upper extremities, bilateral lower extremities, hands, feet, fingers, toes, fingernails, and toenails. All findings within normal limits unless otherwise noted below.  Dyspigmented scars clear with the exception of the melanoma scar on the left side of his scalp.   Mid Tip of Nose Erythematous patches with gritty scale.  Right Parietal Scalp Hyperkeratotic scale with pink base        Left Scalp Recurrent pigment in previous melanoma scar.         Assessment & Plan  Encounter for screening for malignant neoplasm of skin  Biannual skin examination   AK (actinic keratosis) Mid Tip of Nose  Destruction of lesion - Mid Tip of Nose Complexity: simple   Destruction method: cryotherapy   Informed consent: discussed and consent obtained   Timeout:  patient name, date of birth, surgical site, and procedure verified Lesion destroyed using liquid nitrogen: Yes   Cryotherapy cycles:  3 Outcome: patient tolerated procedure well with no complications    Neoplasm of uncertain behavior of skin (2) Right Parietal  Scalp  Skin / nail biopsy Type of biopsy: tangential   Informed consent: discussed and consent obtained   Timeout: patient name, date of birth, surgical site, and procedure verified   Procedure prep:  Patient was prepped and draped in usual sterile fashion (Non sterile) Prep type:  Chlorhexidine Anesthesia: the lesion was anesthetized in a standard fashion   Anesthetic:  1% lidocaine w/ epinephrine 1-100,000 local infiltration Instrument used: flexible razor blade   Outcome: patient tolerated procedure well   Post-procedure details: wound care instructions given    Specimen 1 - Surgical pathology Differential Diagnosis: r/o atypia   Check Margins: yes  Left Scalp  Skin / nail biopsy Type of biopsy: tangential   Informed consent: discussed and consent obtained   Timeout: patient name, date of birth, surgical site, and procedure verified   Procedure prep:  Patient was prepped and draped in usual sterile fashion (Non sterile) Prep type:  Chlorhexidine Anesthesia: the lesion was anesthetized in a standard fashion   Anesthetic:  1% lidocaine w/ epinephrine 1-100,000 local infiltration Instrument used: flexible razor blade   Outcome: patient tolerated procedure well   Post-procedure details: wound care instructions given    Specimen 2 - Surgical pathology Differential Diagnosis: r/o atypia   Check Margins: yes    I, Dung Salinger, PA-C, have reviewed all documentation's for this visit.  The documentation on 04/26/22 for the exam, diagnosis, procedures and orders are all accurate and complete.

## 2022-04-26 NOTE — Telephone Encounter (Signed)
-----   Message from Warren Danes, [place] sent at 04/26/2022  2:21 PM EDT ----- Rtc if recurs

## 2022-04-26 NOTE — Telephone Encounter (Signed)
Pathology to patient.  °

## 2022-12-15 ENCOUNTER — Encounter: Payer: Self-pay | Admitting: Gastroenterology

## 2023-01-10 DIAGNOSIS — M7662 Achilles tendinitis, left leg: Secondary | ICD-10-CM | POA: Insufficient documentation

## 2023-03-28 ENCOUNTER — Encounter: Payer: Self-pay | Admitting: Gastroenterology

## 2023-04-01 ENCOUNTER — Telehealth: Payer: Self-pay | Admitting: *Deleted

## 2023-04-01 NOTE — Telephone Encounter (Signed)
Spoke with pt regarding issue and issue resolved.

## 2023-04-01 NOTE — Telephone Encounter (Signed)
Attempt to reach pt to rescedule Pre-visit appointment to another time the same day. LM with call back #

## 2023-04-01 NOTE — Telephone Encounter (Signed)
Spoke with pt and moved time for pre-vist to same day @ 8:30 am per pt's approval

## 2023-04-01 NOTE — Telephone Encounter (Signed)
Patient returning call. Please advise

## 2023-04-22 ENCOUNTER — Ambulatory Visit (AMBULATORY_SURGERY_CENTER): Payer: BC Managed Care – PPO

## 2023-04-22 VITALS — Ht 73.0 in | Wt 245.0 lb

## 2023-04-22 DIAGNOSIS — Z86010 Personal history of colonic polyps: Secondary | ICD-10-CM

## 2023-04-22 DIAGNOSIS — Z800 Family history of malignant neoplasm of digestive organs: Secondary | ICD-10-CM

## 2023-04-22 MED ORDER — NA SULFATE-K SULFATE-MG SULF 17.5-3.13-1.6 GM/177ML PO SOLN
1.0000 | Freq: Once | ORAL | 0 refills | Status: AC
Start: 2023-04-22 — End: 2023-04-22

## 2023-04-22 NOTE — Progress Notes (Signed)
No egg or soy allergy known to patient  No issues known to pt with past sedation with any surgeries or procedures Patient denies ever being told they had issues or difficulty with intubation  No FH of Malignant Hyperthermia Pt is not on diet pills Pt is not on  home 02  Pt is not on blood thinners  Pt denies issues with constipation  No A fib or A flutter Have any cardiac testing pending--no  LOA: independent  Patient's chart reviewed by Cathlyn Parsons CNRA prior to previsit and patient appropriate for the [facility].  Previsit completed and red dot placed by patient's name on their procedure day (on provider's schedule).     PV completed. Prep reviewed with patient. Instructions sent out via mychart and to home address. Goodrx coupon for [facility] provided.  Pt instructed to use Singlecare.com or GoodRx for a price reduction on prep if needed

## 2023-04-27 ENCOUNTER — Encounter: Payer: Self-pay | Admitting: Gastroenterology

## 2023-05-26 ENCOUNTER — Ambulatory Visit (AMBULATORY_SURGERY_CENTER): Payer: BC Managed Care – PPO | Admitting: Gastroenterology

## 2023-05-26 ENCOUNTER — Encounter: Payer: Self-pay | Admitting: Gastroenterology

## 2023-05-26 VITALS — BP 126/64 | HR 65 | Temp 97.5°F | Resp 14 | Ht 73.0 in | Wt 245.0 lb

## 2023-05-26 DIAGNOSIS — Z86010 Personal history of colonic polyps: Secondary | ICD-10-CM

## 2023-05-26 DIAGNOSIS — D120 Benign neoplasm of cecum: Secondary | ICD-10-CM

## 2023-05-26 DIAGNOSIS — D122 Benign neoplasm of ascending colon: Secondary | ICD-10-CM

## 2023-05-26 DIAGNOSIS — Z800 Family history of malignant neoplasm of digestive organs: Secondary | ICD-10-CM

## 2023-05-26 DIAGNOSIS — Z09 Encounter for follow-up examination after completed treatment for conditions other than malignant neoplasm: Secondary | ICD-10-CM

## 2023-05-26 DIAGNOSIS — D123 Benign neoplasm of transverse colon: Secondary | ICD-10-CM

## 2023-05-26 MED ORDER — SODIUM CHLORIDE 0.9 % IV SOLN
500.0000 mL | Freq: Once | INTRAVENOUS | Status: DC
Start: 2023-05-26 — End: 2023-05-26

## 2023-05-26 NOTE — Patient Instructions (Signed)
Handouts Provided:  Polyps and High Fiber Diet  YOU HAD AN ENDOSCOPIC PROCEDURE TODAY AT [facility]:   Refer to the procedure report that was given to you for any specific questions about what was found during the examination.  If the procedure report does not answer your questions, please call your gastroenterologist to clarify.  If you requested that your care partner not be given the details of your procedure findings, then the procedure report has been included in a sealed envelope for you to review at your convenience later.  YOU SHOULD EXPECT: Some feelings of bloating in the abdomen. Passage of more gas than usual.  Walking can help get rid of the air that was put into your GI tract during the procedure and reduce the bloating. If you had a lower endoscopy (such as a colonoscopy or flexible sigmoidoscopy) you may notice spotting of blood in your stool or on the toilet paper. If you underwent a bowel prep for your procedure, you may not have a normal bowel movement for a few days.  Please Note:  You might notice some irritation and congestion in your nose or some drainage.  This is from the oxygen used during your procedure.  There is no need for concern and it should clear up in a day or so.  SYMPTOMS TO REPORT IMMEDIATELY:  Following lower endoscopy (colonoscopy or flexible sigmoidoscopy):  Excessive amounts of blood in the stool  Significant tenderness or worsening of abdominal pains  Swelling of the abdomen that is new, acute  Fever of 100F or higher  For urgent or emergent issues, a gastroenterologist can be reached at any hour by calling [phone]. Do not use MyChart messaging for urgent concerns.    DIET:  We do recommend a small meal at first, but then you may proceed to your regular diet.  Drink plenty of fluids but you should avoid alcoholic beverages for 24 hours.  ACTIVITY:  You should plan to take it easy for the rest of today and you should NOT DRIVE  or use heavy machinery until tomorrow (because of the sedation medicines used during the test).    FOLLOW UP: Our staff will call the number listed on your records the next business day following your procedure.  We will call around 7:15- 8:00 am to check on you and address any questions or concerns that you may have regarding the information given to you following your procedure. If we do not reach you, we will leave a message.     If any biopsies were taken you will be contacted by phone or by letter within the next 1-3 weeks.  Please call us at [phone] if you have not heard about the biopsies in 3 weeks.    SIGNATURES/CONFIDENTIALITY: You and/or your care partner have signed paperwork which will be entered into your electronic medical record.  These signatures attest to the fact that that the information above on your After Visit Summary has been reviewed and is understood.  Full responsibility of the confidentiality of this discharge information lies with you and/or your care-partner.

## 2023-05-26 NOTE — Progress Notes (Signed)
History & Physical  Primary Care Physician:  Herma Carson, MD Primary Gastroenterologist: Claudette Head, MD  Impression / Plan:  Personal history of adenomatous colon polyps, family history of colon cancer first-degree relative, for colonoscopy.  CHIEF COMPLAINT:  CRC screening, Personal history of colon polyps   HPI: Kenneth Hoffman is a 57 y.o. male with a personal history of adenomatous colon polyps, family history of colon cancer first-degree relative, colonoscopy.    Past Medical History:  Diagnosis Date   Allergic rhinitis, cause unspecified    Atypical mole 09/05/2014   left upper thigh-moderate to severe   Atypical mole 10/02/2014   left mid outer thigh-mod to severe   Atypical mole 10/02/2014   left inner upper thigh-moderate   Atypical mole 10/02/2014   right upper abdomen-moderate to severe   Atypical mole 02/11/2015   right cheek-mild   Atypical mole 03/13/2015   right lower back-moderate   Atypical mole 02/14/2018   right thigh-moderate   Atypical mole 01/18/2020   right chest-severe   BPH (benign prostatic hyperplasia)    Cancer (HCC)    melanoma    Hyperlipidemia    Hypertension    no meds   Melanoma (HCC) 09/05/2014   left scalp-level 2-tx-Dr.Leshin   Sleep apnea    wears cpap    Past Surgical History:  Procedure Laterality Date   COLONOSCOPY  2008,2013   Dr. Russella Dar; family hx colon cancer in mother   MELANOMA EXCISION  3 years ago   head   VASECTOMY      Prior to Admission medications   Medication Sig Start Date End Date Taking? Authorizing Provider  aspirin 81 MG tablet Take 81 mg by mouth daily.   Yes [provider]  Cholecalciferol (VITAMIN D) 2000 UNITS CAPS Take by mouth. Takes 3 capsules daily to equal 6000 units daily   Yes [provider]  fexofenadine-pseudoephedrine (ALLEGRA-D ALLERGY & CONGESTION) 180-240 MG 24 hr tablet Take 1 tablet by mouth daily. 03/04/17  Yes Lucky Cowboy, MD  fluticasone [facility]) 50  MCG/ACT nasal spray Place 1 spray into both nostrils daily. 11/09/16  Yes [provider]  lisinopril (ZESTRIL) 20 MG tablet Take 1 tablet by mouth daily. 12/07/22  Yes [provider]  rosuvastatin (CRESTOR) 10 MG tablet TAKE 1 TABLET BY MOUTH EVERY DAY 02/20/17  Yes Lucky Cowboy, MD  tamsulosin (FLOMAX) 0.4 MG CAPS capsule TAKE 1 CAPSULE BY MOUTH EVERY DAY 10/29/19  Yes [provider]  famciclovir (FAMVIR) 500 MG tablet Take 500 mg by mouth daily. 04/10/22   [provider]  Fluorouracil (TOLAK) 4 % CREA Apply to face/scalp nightly x 2 weeks Patient not taking: Reported on 04/22/2023 07/16/21   Glyn Ade, PA-C  meloxicam (MOBIC) 15 MG tablet Take by mouth. Patient not taking: Reported on 04/22/2023 01/18/22   [provider]    Current Outpatient Medications  Medication Sig Dispense Refill   aspirin 81 MG tablet Take 81 mg by mouth daily.     Cholecalciferol (VITAMIN D) 2000 UNITS CAPS Take by mouth. Takes 3 capsules daily to equal 6000 units daily     fexofenadine-pseudoephedrine (ALLEGRA-D ALLERGY & CONGESTION) 180-240 MG 24 hr tablet Take 1 tablet by mouth daily. 90 tablet 0   fluticasone (FLONASE) 50 MCG/ACT nasal spray Place 1 spray into both nostrils daily.     lisinopril (ZESTRIL) 20 MG tablet Take 1 tablet by mouth daily.     rosuvastatin (CRESTOR) 10 MG tablet TAKE 1 TABLET BY  MOUTH EVERY DAY 90 tablet 1   tamsulosin (FLOMAX) 0.4 MG CAPS capsule TAKE 1 CAPSULE BY MOUTH EVERY DAY     famciclovir (FAMVIR) 500 MG tablet Take 500 mg by mouth daily.     Fluorouracil (TOLAK) 4 % CREA Apply to face/scalp nightly x 2 weeks (Patient not taking: Reported on 04/22/2023) 40 g 1   meloxicam (MOBIC) 15 MG tablet Take by mouth. (Patient not taking: Reported on 04/22/2023)     Current Facility-Administered Medications  Medication Dose Route Frequency Provider Last Rate Last Admin   0.9 %  sodium chloride infusion  500 mL Intravenous Once Claudette Head T, MD       0.9 %  sodium chloride infusion  500 mL Intravenous Once Meryl Dare, MD        Allergies as of 05/26/2023   (No Known Allergies)    Family History  Problem Relation Age of Onset   Colon cancer Mother 54   Cancer Mother    Diabetes Mother    Kidney disease Mother    COPD Mother    Cancer Father        Prostate, Lymphoma   Hyperlipidemia Father    Stroke Father    Hyperlipidemia Brother    Stomach cancer Neg Hx     Social History   Socioeconomic History   Marital status: Married    Spouse name: Not on file   Number of children: Not on file   Years of education: Not on file   Highest education level: Not on file  Occupational History   Not on file  Tobacco Use   Smoking status: Never   Smokeless tobacco: Never  Vaping Use   Vaping status: Never Used  Substance and Sexual Activity   Alcohol use: Yes    Comment: 3 times a week    Drug use: No   Sexual activity: Yes    Birth control/protection: Surgical  Other Topics Concern   Not on file  Social History Narrative   Not on file   Social Determinants of Health   Financial Resource Strain: Not on file  Food Insecurity: Low Risk  (02/21/2023)   [facility]   Food vital sign    Within the past 12 months, you worried that your food would run out before you got money to buy more: Never true    Within the past 12 months, the food you bought just didn't last and you didn't have money to get more. : Never true  Transportation Needs: No Transportation Needs (02/21/2023)   [facility]   Transportation    In the past 12 months, has lack of reliable transportation kept you from medical appointments, meetings, work or from getting things needed for daily living? : No  Physical Activity: Not on file  Stress: Not on file  Social Connections: Not on file  Intimate Partner Violence: Not on file    Review of Systems:  All systems reviewed  were negative except where noted in HPI.   Physical Exam:  General:  Alert, well-developed, in NAD Head:  Normocephalic and atraumatic. Eyes:  Sclera clear, no icterus.   Conjunctiva pink. Ears:  Normal auditory acuity. Mouth:  No deformity or lesions.  Neck:  Supple; no masses. Lungs:  Clear throughout to auscultation.   No wheezes, crackles, or rhonchi.  Heart:  Regular rate and rhythm; no murmurs. Abdomen:  Soft, nondistended, nontender. No masses, hepatomegaly. No  palpable masses.  Normal bowel sounds.    Rectal:  Deferred   Msk:  Symmetrical without gross deformities. Extremities:  Without edema. Neurologic:  Alert and  oriented x 4; grossly normal neurologically. Skin:  Intact without significant lesions or rashes. Psych:  Alert and cooperative. Normal mood and affect.   Venita Lick. Russella Dar  05/26/2023, 9:13 AM See Loretha Stapler, [facility], to contact our on call provider

## 2023-05-26 NOTE — Progress Notes (Signed)
Called to room to assist during endoscopic procedure.  Patient ID and intended procedure confirmed with present staff. Received instructions for my participation in the procedure from the performing physician.  

## 2023-05-26 NOTE — Progress Notes (Signed)
Sedate, gd SR, tolerated procedure well, VSS, report to RN 

## 2023-05-26 NOTE — Op Note (Signed)
[facility] Patient Name: Kenneth Hoffman Procedure Date: 05/26/2023 9:19 AM MRN: [id number] Endoscopist: Meryl Dare , MD, [phone] Age: 57 Referring MD:  Date of Birth: January 02, 1966 Gender: Male Account #: [id number] Procedure:                Colonoscopy Indications:              Surveillance: Personal history of adenomatous                            polyps on last colonoscopy 5 years ago, Family                            history of colon cancer, first-degree relative Medicines:                Monitored Anesthesia Care Procedure:                Pre-Anesthesia Assessment:                           - Prior to the procedure, a History and Physical                            was performed, and patient medications and                            allergies were reviewed. The patient's tolerance of                            previous anesthesia was also reviewed. The risks                            and benefits of the procedure and the sedation                            options and risks were discussed with the patient.                            All questions were answered, and informed consent                            was obtained. Prior Anticoagulants: The patient has                            taken no anticoagulant or antiplatelet agents. ASA                            Grade Assessment: II - A patient with mild systemic                            disease. After reviewing the risks and benefits,                            the patient was deemed in satisfactory condition to  undergo the procedure.                           After obtaining informed consent, the colonoscope                            was passed under direct vision. Throughout the                            procedure, the patient's blood pressure, pulse, and                            oxygen saturations were monitored continuously. The                            Olympus Scope SN:  [id number] was introduced through                            the anus and advanced to the the cecum, identified                            by appendiceal orifice and ileocecal valve. The                            ileocecal valve, appendiceal orifice, and rectum                            were photographed. The quality of the bowel                            preparation was adequate after extensive lavage,                            suction. The colonoscopy was performed without                            difficulty. The patient tolerated the procedure                            well. Scope In: 9:31:38 AM Scope Out: 9:53:58 AM Scope Withdrawal Time: 0 hours 20 minutes 16 seconds  Total Procedure Duration: 0 hours 22 minutes 20 seconds  Findings:                 The perianal and digital rectal examinations were                            normal.                           Six sessile polyps were found in the transverse                            colon (2), ascending colon (1) and cecum (3). The  polyps were 5 to 10 mm in size. These polyps were                            removed with a cold snare. Resection and retrieval                            were complete.                           Multiple medium-mouthed diverticula were found in                            the left colon.                           The exam was otherwise without abnormality on                            direct and retroflexion views. Complications:            No immediate complications. Estimated blood loss:                            None. Estimated Blood Loss:     Estimated blood loss: none. Impression:               - Six 5 to 10 mm polyps in the transverse colon, in                            the ascending colon and in the cecum, removed with                            a cold snare. Resected and retrieved.                           - MIld diverticulosis in the left colon.                            - The examination was otherwise normal on direct                            and retroflexion views. Recommendation:           - Repeat colonoscopy, likely 3 years, after studies                            are complete for surveillance based on pathology                            results with a more extensive bowel prep.                           - Patient has a contact number available for  emergencies. The signs and symptoms of potential                            delayed complications were discussed with the                            patient. Return to normal activities tomorrow.                            Written discharge instructions were provided to the                            patient.                           - High fiber diet.                           - Continue present medications.                           - Await pathology results. Meryl Dare, MD 05/26/2023 9:59:56 AM This report has been signed electronically.

## 2023-05-26 NOTE — Progress Notes (Signed)
Pt's states no medical or surgical changes since previsit or office visit. 

## 2023-05-27 ENCOUNTER — Telehealth: Payer: Self-pay | Admitting: *Deleted

## 2023-05-27 NOTE — Telephone Encounter (Signed)
  Follow up Call-     05/26/2023    8:23 AM  Call back number  Post procedure Call Back phone  # [phone]  Permission to leave phone message Yes     Patient questions:  Message left to call us if necessary.

## 2023-06-09 ENCOUNTER — Encounter: Payer: Self-pay | Admitting: Gastroenterology
